# Patient Record
Sex: Male | Born: 1980
Health system: Southern US, Community
[De-identification: ages and names within clinical notes are randomized; demographics above are authoritative.]

## PROBLEM LIST (undated history)

## (undated) ENCOUNTER — Emergency Department (HOSPITAL_COMMUNITY): Discharge status: Against Medical Advice | Payer: Self-pay

## (undated) DIAGNOSIS — F32A Depression, unspecified: Secondary | ICD-10-CM

## (undated) DIAGNOSIS — F329 Major depressive disorder, single episode, unspecified: Secondary | ICD-10-CM

## (undated) HISTORY — DX: Major depressive disorder, single episode, unspecified: F32.9

## (undated) HISTORY — DX: Depression, unspecified: F32.A

---

## 1999-08-19 ENCOUNTER — Encounter: Payer: Self-pay | Admitting: Emergency Medicine

## 1999-08-19 ENCOUNTER — Emergency Department (HOSPITAL_COMMUNITY): Admission: EM | Admit: 1999-08-19 | Discharge: 1999-08-19 | Payer: Self-pay | Admitting: Emergency Medicine

## 2006-07-03 ENCOUNTER — Emergency Department (HOSPITAL_COMMUNITY): Admission: EM | Admit: 2006-07-03 | Discharge: 2006-07-03 | Payer: Self-pay | Admitting: Emergency Medicine

## 2006-07-04 ENCOUNTER — Emergency Department (HOSPITAL_COMMUNITY): Admission: EM | Admit: 2006-07-04 | Discharge: 2006-07-04 | Payer: Self-pay | Admitting: Emergency Medicine

## 2008-01-07 ENCOUNTER — Emergency Department (HOSPITAL_COMMUNITY): Admission: EM | Admit: 2008-01-07 | Discharge: 2008-01-07 | Payer: Self-pay | Admitting: Emergency Medicine

## 2011-08-27 ENCOUNTER — Emergency Department (HOSPITAL_COMMUNITY)
Admission: EM | Admit: 2011-08-27 | Discharge: 2011-08-27 | Disposition: A | Discharge status: Home / Self Care | Payer: Self-pay | Attending: Emergency Medicine | Admitting: Emergency Medicine

## 2011-08-27 DIAGNOSIS — L03211 Cellulitis of face: Secondary | ICD-10-CM | POA: Insufficient documentation

## 2011-08-27 DIAGNOSIS — L0201 Cutaneous abscess of face: Secondary | ICD-10-CM | POA: Insufficient documentation

## 2018-01-26 ENCOUNTER — Ambulatory Visit: Payer: Self-pay | Admitting: Family Medicine

## 2018-01-28 ENCOUNTER — Encounter: Payer: Self-pay | Admitting: Family Medicine

## 2018-01-28 ENCOUNTER — Ambulatory Visit (INDEPENDENT_AMBULATORY_CARE_PROVIDER_SITE_OTHER): Payer: 59 | Admitting: Family Medicine

## 2018-01-28 VITALS — BP 128/80 | HR 55 | Ht 71.25 in | Wt 203.5 lb

## 2018-01-28 DIAGNOSIS — Z0000 Encounter for general adult medical examination without abnormal findings: Secondary | ICD-10-CM

## 2018-01-28 LAB — CBC
HCT: 45.1 % (ref 39.0–52.0)
Hemoglobin: 15.4 g/dL (ref 13.0–17.0)
MCHC: 34.1 g/dL (ref 30.0–36.0)
MCV: 93.2 fl (ref 78.0–100.0)
Platelets: 268 10*3/uL (ref 150.0–400.0)
RBC: 4.83 Mil/uL (ref 4.22–5.81)
RDW: 12.7 % (ref 11.5–15.5)
WBC: 3.9 10*3/uL — ABNORMAL LOW (ref 4.0–10.5)

## 2018-01-28 LAB — URINALYSIS, ROUTINE W REFLEX MICROSCOPIC
Bilirubin Urine: NEGATIVE
Hgb urine dipstick: NEGATIVE
Ketones, ur: NEGATIVE
Leukocytes, UA: NEGATIVE
Nitrite: NEGATIVE
RBC / HPF: NONE SEEN (ref 0–?)
Specific Gravity, Urine: 1.02 (ref 1.000–1.030)
Total Protein, Urine: NEGATIVE
Urine Glucose: NEGATIVE
Urobilinogen, UA: 0.2 (ref 0.0–1.0)
pH: 6.5 (ref 5.0–8.0)

## 2018-01-28 LAB — LIPID PANEL
Cholesterol: 183 mg/dL (ref 0–200)
HDL: 39.7 mg/dL (ref >39.00)
LDL Cholesterol: 121 mg/dL — ABNORMAL HIGH (ref 0–99)
NonHDL: 142.99
Total CHOL/HDL Ratio: 5
Triglycerides: 108 mg/dL (ref 0.0–149.0)
VLDL: 21.6 mg/dL (ref 0.0–40.0)

## 2018-01-28 LAB — TSH: TSH: 1.17 u[IU]/mL (ref 0.35–4.50)

## 2018-01-28 NOTE — Progress Notes (Signed)
Subjective:  Patient ID: Alvin Smith, male    DOB: 09-10-1981  Age: 37 y.o. MRN: [id number]  CC: Establish Care   HPI Alvin Smith presents for exam.  As far as he knows he enjoys good health.  He is married and has 3 children.  38 year old daughter and 376 and 38-year-old sons.  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  He still manages to find time to work out an hour each day.  He rarely drinks alcohol and does not smoke or use illicit drugs.  His father died at age 38 of an MI.  His dad had smoked since he was 37 years old.  His mother is healthy at age 363.  History Alvin Smith has no past medical history on file.   He has no past surgical history on file.   His family history is not on file.He reports that he has never smoked. He has never used smokeless tobacco. His alcohol and drug histories are not on file.  No outpatient medications prior to visit.   No facility-administered medications prior to visit.     ROS Review of Systems  Constitutional: Negative.   HENT: Negative.   Eyes: Negative.   Respiratory: Negative.   Cardiovascular: Negative.   Gastrointestinal: Negative.   Endocrine: Negative.   Genitourinary: Negative.   Musculoskeletal: Negative.   Skin: Negative for wound.  Allergic/Immunologic: Negative for immunocompromised state.  Neurological: Negative for weakness and headaches.  Hematological: Does not bruise/bleed easily.  Psychiatric/Behavioral: Negative.     Objective:  BP 128/80 (BP Location: Left Arm, Patient Position: Sitting, Cuff Size: Normal)   Pulse (!) 55   Ht 5' 11.25" (1.81 m)   Wt 203 lb 8 oz (92.3 kg)   SpO2 98%   BMI 28.18 kg/m   Physical Exam  Constitutional: He is oriented to person, place, and time. He appears well-developed and well-nourished. No distress.  HENT:  Head: Normocephalic and atraumatic.  Right Ear: External ear normal.  Left Ear: External ear normal.    Mouth/Throat: Oropharynx is clear and moist. No oropharyngeal exudate.  Eyes: Pupils are equal, round, and reactive to light. Conjunctivae are normal. Right eye exhibits no discharge. Left eye exhibits no discharge. No scleral icterus.  Neck: Neck supple. No JVD present. No tracheal deviation present. No thyromegaly present.  Cardiovascular: Normal rate, regular rhythm and normal heart sounds.  Pulmonary/Chest: Effort normal and breath sounds normal. No stridor.  Abdominal: Soft. Bowel sounds are normal. He exhibits no distension. There is no hepatosplenomegaly, splenomegaly or hepatomegaly. There is no tenderness. No hernia. Hernia confirmed negative in the ventral area, confirmed negative in the right inguinal area and confirmed negative in the left inguinal area.  Genitourinary: Testes normal. Right testis shows no mass and no swelling. Right testis is descended. Left testis shows no mass, no swelling and no tenderness. Left testis is descended. Circumcised. No hypospadias, penile erythema or penile tenderness. No discharge found.  Lymphadenopathy:    He has no cervical adenopathy.       Right: No inguinal adenopathy present.       Left: No inguinal adenopathy present.  Neurological: He is alert and oriented to person, place, and time.  Skin: Skin is warm and dry. He is not diaphoretic.  Psychiatric: He has a normal mood and affect. His behavior is normal.      Assessment & Plan:   Alvin Smith was seen today  for establish care.  Diagnoses and all orders for this visit:  Healthcare maintenance -     CBC -     Comprehensive metabolic panel -     HIV antibody -     Lipid panel -     Urinalysis, Routine w reflex microscopic -     TSH   Alvin Smith does not currently have medications on file.  No orders of the defined types were placed in this encounter.  Continue healthy lifestyle and follow-up as needed.  Blood work results will recommended further follow-up.  Follow-up: Return  fu pends results of blood work.  Mliss Sax, MD

## 2018-01-28 NOTE — Patient Instructions (Signed)
Preventive Care 18-39 Years, Male Preventive care refers to lifestyle choices and visits with your health care provider that can promote health and wellness. What does preventive care include?  A yearly physical exam. This is also called an annual well check.  Dental exams once or twice a year.  Routine eye exams. Ask your health care provider how often you should have your eyes checked.  Personal lifestyle choices, including: ? Daily care of your teeth and gums. ? Regular physical activity. ? Eating a healthy diet. ? Avoiding tobacco and drug use. ? Limiting alcohol use. ? Practicing safe sex. What happens during an annual well check? The services and screenings done by your health care provider during your annual well check will depend on your age, overall health, lifestyle risk factors, and family history of disease. Counseling Your health care provider may ask you questions about your:  Alcohol use.  Tobacco use.  Drug use.  Emotional well-being.  Home and relationship well-being.  Sexual activity.  Eating habits.  Work and work Statistician.  Screening You may have the following tests or measurements:  Height, weight, and BMI.  Blood pressure.  Lipid and cholesterol levels. These may be checked every 5 years starting at age 80.  Diabetes screening. This is done by checking your blood sugar (glucose) after you have not eaten for a while (fasting).  Skin check.  Hepatitis C blood test.  Hepatitis B blood test.  Sexually transmitted disease (STD) testing.  Discuss your test results, treatment options, and if necessary, the need for more tests with your health care provider. Vaccines Your health care provider may recommend certain vaccines, such as:  Influenza vaccine. This is recommended every year.  Tetanus, diphtheria, and acellular pertussis (Tdap, Td) vaccine. You may need a Td booster every 10 years.  Varicella vaccine. You may need this if you  have not been vaccinated.  HPV vaccine. If you are 23 or younger, you may need three doses over 6 months.  Measles, mumps, and rubella (MMR) vaccine. You may need at least one dose of MMR.You may also need a second dose.  Pneumococcal 13-valent conjugate (PCV13) vaccine. You may need this if you have certain conditions and have not been vaccinated.  Pneumococcal polysaccharide (PPSV23) vaccine. You may need one or two doses if you smoke cigarettes or if you have certain conditions.  Meningococcal vaccine. One dose is recommended if you are age 59-21 years and a first-year college student living in a residence hall, or if you have one of several medical conditions. You may also need additional booster doses.  Hepatitis A vaccine. You may need this if you have certain conditions or if you travel or work in places where you may be exposed to hepatitis A.  Hepatitis B vaccine. You may need this if you have certain conditions or if you travel or work in places where you may be exposed to hepatitis B.  Haemophilus influenzae type b (Hib) vaccine. You may need this if you have certain risk factors.  Talk to your health care provider about which screenings and vaccines you need and how often you need them. This information is not intended to replace advice given to you by your health care provider. Make sure you discuss any questions you have with your health care provider. Document Released: 12/23/2001 Document Revised: 07/16/2016 Document Reviewed: 08/28/2015 Elsevier Interactive Patient Education  2018 [place] Maintenance, Male A healthy lifestyle and preventive care is important for your health  and wellness. Ask your health care provider about what schedule of regular examinations is right for you. What should I know about weight and diet? Eat a Healthy Diet  Eat plenty of vegetables, fruits, whole grains, low-fat dairy products, and lean protein.  Do not eat a lot of foods  high in solid fats, added sugars, or salt.  Maintain a Healthy Weight Regular exercise can help you achieve or maintain a healthy weight. You should:  Do at least 150 minutes of exercise each week. The exercise should increase your heart rate and make you sweat (moderate-intensity exercise).  Do strength-training exercises at least twice a week.  Watch Your Levels of Cholesterol and Blood Lipids  Have your blood tested for lipids and cholesterol every 5 years starting at 38 years of age. If you are at high risk for heart disease, you should start having your blood tested when you are 38 years old. You may need to have your cholesterol levels checked more often if: ? Your lipid or cholesterol levels are high. ? You are older than 38 years of age. ? You are at high risk for heart disease.  What should I know about cancer screening? Many types of cancers can be detected early and may often be prevented. Lung Cancer  You should be screened every year for lung cancer if: ? You are a current smoker who has smoked for at least 30 years. ? You are a former smoker who has quit within the past 15 years.  Talk to your health care provider about your screening options, when you should start screening, and how often you should be screened.  Colorectal Cancer  Routine colorectal cancer screening usually begins at 38 years of age and should be repeated every 5-10 years until you are 38 years old. You may need to be screened more often if early forms of precancerous polyps or small growths are found. Your health care provider may recommend screening at an earlier age if you have risk factors for colon cancer.  Your health care provider may recommend using home test kits to check for hidden blood in the stool.  A small camera at the end of a tube can be used to examine your colon (sigmoidoscopy or colonoscopy). This checks for the earliest forms of colorectal cancer.  Prostate and Testicular  Cancer  Depending on your age and overall health, your health care provider may do certain tests to screen for prostate and testicular cancer.  Talk to your health care provider about any symptoms or concerns you have about testicular or prostate cancer.  Skin Cancer  Check your skin from head to toe regularly.  Tell your health care provider about any new moles or changes in moles, especially if: ? There is a change in a mole's size, shape, or color. ? You have a mole that is larger than a pencil eraser.  Always use sunscreen. Apply sunscreen liberally and repeat throughout the day.  Protect yourself by wearing long sleeves, pants, a wide-brimmed hat, and sunglasses when outside.  What should I know about heart disease, diabetes, and high blood pressure?  If you are 70-73 years of age, have your blood pressure checked every 3-5 years. If you are 94 years of age or older, have your blood pressure checked every year. You should have your blood pressure measured twice-once when you are at a hospital or clinic, and once when you are not at a hospital or clinic. Record the average of the  two measurements. To check your blood pressure when you are not at a hospital or clinic, you can use: ? An automated blood pressure machine at a pharmacy. ? A home blood pressure monitor.  Talk to your health care provider about your target blood pressure.  If you are between 40-63 years old, ask your health care provider if you should take aspirin to prevent heart disease.  Have regular diabetes screenings by checking your fasting blood sugar level. ? If you are at a normal weight and have a low risk for diabetes, have this test once every three years after the age of 28. ? If you are overweight and have a high risk for diabetes, consider being tested at a younger age or more often.  A one-time screening for abdominal aortic aneurysm (AAA) by ultrasound is recommended for men aged 26-75 years who are  current or former smokers. What should I know about preventing infection? Hepatitis B If you have a higher risk for hepatitis B, you should be screened for this virus. Talk with your health care provider to find out if you are at risk for hepatitis B infection. Hepatitis C Blood testing is recommended for:  Everyone born from [birth year] through 1966.  Anyone with known risk factors for hepatitis C.  Sexually Transmitted Diseases (STDs)  You should be screened each year for STDs including gonorrhea and chlamydia if: ? You are sexually active and are younger than 38 years of age. ? You are older than 38 years of age and your health care provider tells you that you are at risk for this type of infection. ? Your sexual activity has changed since you were last screened and you are at an increased risk for chlamydia or gonorrhea. Ask your health care provider if you are at risk.  Talk with your health care provider about whether you are at high risk of being infected with HIV. Your health care provider may recommend a prescription medicine to help prevent HIV infection.  What else can I do?  Schedule regular health, dental, and eye exams.  Stay current with your vaccines (immunizations).  Do not use any tobacco products, such as cigarettes, chewing tobacco, and e-cigarettes. If you need help quitting, ask your health care provider.  Limit alcohol intake to no more than 2 drinks per day. One drink equals 12 ounces of beer, 5 ounces of wine, or 1 ounces of hard liquor.  Do not use street drugs.  Do not share needles.  Ask your health care provider for help if you need support or information about quitting drugs.  Tell your health care provider if you often feel depressed.  Tell your health care provider if you have ever been abused or do not feel safe at home. This information is not intended to replace advice given to you by your health care provider. Make sure you discuss any questions  you have with your health care provider. Document Released: 04/24/2008 Document Revised: 06/25/2016 Document Reviewed: 07/31/2015 Elsevier Interactive Patient Education  [employer].

## 2018-01-29 LAB — COMPREHENSIVE METABOLIC PANEL
AG Ratio: 1.7 (calc) (ref 1.0–2.5)
ALT: 27 U/L (ref 9–46)
AST: 28 U/L (ref 10–40)
Albumin: 4.5 g/dL (ref 3.6–5.1)
Alkaline phosphatase (APISO): 65 U/L (ref 40–115)
BUN: 23 mg/dL (ref 7–25)
CO2: 28 mmol/L (ref 20–32)
Calcium: 9.6 mg/dL (ref 8.6–10.3)
Chloride: 106 mmol/L (ref 98–110)
Creat: 1.18 mg/dL (ref 0.60–1.35)
Globulin: 2.7 g/dL (calc) (ref 1.9–3.7)
Glucose, Bld: 90 mg/dL (ref 65–99)
Potassium: 4.3 mmol/L (ref 3.5–5.3)
Sodium: 138 mmol/L (ref 135–146)
Total Bilirubin: 0.5 mg/dL (ref 0.2–1.2)
Total Protein: 7.2 g/dL (ref 6.1–8.1)

## 2018-01-29 LAB — HIV ANTIBODY (ROUTINE TESTING W REFLEX): HIV 1&2 Ab, 4th Generation: NONREACTIVE

## 2018-07-29 ENCOUNTER — Encounter: Payer: Self-pay | Admitting: Family Medicine

## 2018-07-29 ENCOUNTER — Ambulatory Visit (INDEPENDENT_AMBULATORY_CARE_PROVIDER_SITE_OTHER): Payer: 59 | Admitting: Family Medicine

## 2018-07-29 ENCOUNTER — Ambulatory Visit (INDEPENDENT_AMBULATORY_CARE_PROVIDER_SITE_OTHER): Payer: 59

## 2018-07-29 VITALS — BP 128/78 | HR 65 | Ht 71.25 in | Wt 193.0 lb

## 2018-07-29 DIAGNOSIS — M25561 Pain in right knee: Secondary | ICD-10-CM

## 2018-07-29 DIAGNOSIS — S83242A Other tear of medial meniscus, current injury, left knee, initial encounter: Secondary | ICD-10-CM | POA: Insufficient documentation

## 2018-07-29 DIAGNOSIS — M170 Bilateral primary osteoarthritis of knee: Secondary | ICD-10-CM | POA: Diagnosis not present

## 2018-07-29 MED ORDER — MELOXICAM 15 MG PO TABS: 15.0000 mg | ORAL_TABLET | Freq: Every day | ORAL | 0 refills | Status: DC

## 2018-07-29 NOTE — Progress Notes (Signed)
Subjective:  Patient ID: Alvin Smith, male    DOB: 1981-08-31  Age: 37 y.o. MRN: [id number]  CC: Knee Pain   HPI Alvin Smith presents for patient has been experiencing pain with both of his knees over the last 2 to 3 months.  Currently he is unable to walk any distance without pain.  The left knee is bothering him more so than the right.  No specific injury in his past.  The left knee swells and feels unstable.  There is been no locking or giving way.  He has been a runner up until he started experiencing this pain.  He can think of no sports injury in his distant past.  No outpatient medications prior to visit.   No facility-administered medications prior to visit.     ROS Review of Systems  Constitutional: Negative.   Respiratory: Negative.   Cardiovascular: Negative.   Gastrointestinal: Negative.   Musculoskeletal: Positive for arthralgias, gait problem and joint swelling.  Hematological: Does not bruise/bleed easily.  Psychiatric/Behavioral: Negative.     Objective:  BP 128/78   Pulse 65   Ht 5' 11.25" (1.81 m)   Wt 193 lb (87.5 kg)   SpO2 98%   BMI 26.73 kg/m   BP Readings from Last 3 Encounters:  07/29/18 128/78  01/28/18 128/80    Wt Readings from Last 3 Encounters:  07/29/18 193 lb (87.5 kg)  01/28/18 203 lb 8 oz (92.3 kg)    Physical Exam  Constitutional: He is oriented to person, place, and time. He appears well-developed and well-nourished. No distress.  Pulmonary/Chest: Effort normal.  Musculoskeletal:       Right knee: He exhibits normal range of motion, no swelling, no effusion and no deformity. No tenderness found. No medial joint line and no lateral joint line tenderness noted.       Left knee: He exhibits decreased range of motion, swelling and effusion (small). Tenderness found. Medial joint line tenderness noted.       Legs: Neurological: He is alert and oriented to person, place, and time.  Skin: He is not diaphoretic.    Lab Results    Component Value Date   WBC 3.9 (L) 01/28/2018   HGB 15.4 01/28/2018   HCT 45.1 01/28/2018   PLT 268.0 01/28/2018   GLUCOSE 90 01/28/2018   CHOL 183 01/28/2018   TRIG 108.0 01/28/2018   HDL 39.70 01/28/2018   LDLCALC 121 (H) 01/28/2018   ALT 27 01/28/2018   AST 28 01/28/2018   NA 138 01/28/2018   K 4.3 01/28/2018   CL 106 01/28/2018   CREATININE 1.18 01/28/2018   BUN 23 01/28/2018   CO2 28 01/28/2018   TSH 1.17 01/28/2018    No results found.  Assessment & Plan:   Alvin JohnBrian was seen today for knee pain.  Diagnoses and all orders for this visit:  Tear of medial meniscus of left knee, current, unspecified tear type, initial encounter -     DG Knee Complete 4 Views Left; Future -     MR Knee Left  Wo Contrast; Future -     meloxicam (MOBIC) 15 MG tablet; Take 1 tablet (15 mg total) by mouth daily. With food for 14 days and then as needed. -     DG Knee Complete 4 Views Left  Acute pain of right knee -     DG Knee Complete 4 Views Right; Future -     meloxicam (MOBIC) 15 MG tablet; Take 1  tablet (15 mg total) by mouth daily. With food for 14 days and then as needed. -     DG Knee Complete 4 Views Right   I am having Alvin Smith start on meloxicam.  Meds ordered this encounter  Medications  . meloxicam (MOBIC) 15 MG tablet    Sig: Take 1 tablet (15 mg total) by mouth daily. With food for 14 days and then as needed.    Dispense:  30 tablet    Refill:  0     Follow-up: Return in about 1 month (around 08/28/2018).  Mliss Sax, MD

## 2018-07-29 NOTE — Patient Instructions (Signed)

## 2018-08-07 ENCOUNTER — Other Ambulatory Visit: Payer: 59

## 2018-08-07 ENCOUNTER — Ambulatory Visit
Admission: RE | Admit: 2018-08-07 | Discharge: 2018-08-07 | Disposition: A | Discharge status: Home / Self Care | Payer: 59 | Source: Ambulatory Visit | Attending: Family Medicine | Admitting: Family Medicine

## 2018-08-07 DIAGNOSIS — S83242A Other tear of medial meniscus, current injury, left knee, initial encounter: Secondary | ICD-10-CM

## 2018-08-07 DIAGNOSIS — M23322 Other meniscus derangements, posterior horn of medial meniscus, left knee: Secondary | ICD-10-CM | POA: Diagnosis not present

## 2018-08-11 ENCOUNTER — Other Ambulatory Visit: Payer: Self-pay

## 2018-08-11 DIAGNOSIS — S83242A Other tear of medial meniscus, current injury, left knee, initial encounter: Secondary | ICD-10-CM

## 2018-08-17 ENCOUNTER — Ambulatory Visit (INDEPENDENT_AMBULATORY_CARE_PROVIDER_SITE_OTHER): Payer: 59 | Admitting: Orthopaedic Surgery

## 2018-08-17 ENCOUNTER — Encounter (INDEPENDENT_AMBULATORY_CARE_PROVIDER_SITE_OTHER): Payer: Self-pay | Admitting: Orthopaedic Surgery

## 2018-08-17 VITALS — BP 123/71 | HR 53 | Ht 71.0 in | Wt 193.0 lb

## 2018-08-17 DIAGNOSIS — M23204 Derangement of unspecified medial meniscus due to old tear or injury, left knee: Secondary | ICD-10-CM | POA: Diagnosis not present

## 2018-08-17 DIAGNOSIS — M1712 Unilateral primary osteoarthritis, left knee: Secondary | ICD-10-CM

## 2018-08-17 MED ORDER — BUPIVACAINE HCL 0.5 % IJ SOLN
2.0000 mL | INTRAMUSCULAR | Status: AC | PRN
  Administered 2018-08-17: 2 mL via INTRA_ARTICULAR

## 2018-08-17 MED ORDER — METHYLPREDNISOLONE ACETATE 40 MG/ML IJ SUSP
80.0000 mg | INTRAMUSCULAR | Status: AC | PRN
  Administered 2018-08-17: 80 mg

## 2018-08-17 MED ORDER — LIDOCAINE HCL 1 % IJ SOLN
2.0000 mL | INTRAMUSCULAR | Status: AC | PRN
  Administered 2018-08-17: 2 mL

## 2018-08-17 NOTE — Progress Notes (Signed)
Office Visit Note   Patient: Alvin Smith           Date of Birth: [birth year]           MRN: [id number] Visit Date: 08/17/2018              Requested by: Mliss Sax, MD [address] [place], [place] [address] PCP: Mliss Sax, MD   Assessment & Plan: Visit Diagnoses:  1. Degenerative tear of medial meniscus of left knee   2. Unilateral primary osteoarthritis, left knee     Plan:  #1: Corticosteroid injection to the left knee was accomplished atraumatically. #2: Limit his exercises to nonweightbearing or limited weightbearing activities.  Thus no running. #3: If doing well in the next 4 weeks he may then increase his activity but understand that he should do low impact exercises. #4: We had a long discussion of limiting his activities such as running.  He will be able to work out in the gym but again limiting impact.  He can do short arc quads etc. comfortably as he desires.  Icing to the knee as indicated.  Follow-Up Instructions: Return in about 4 weeks (around 09/14/2018).   Face-to-face time spent with patient was greater than 30 minutes.  Greater than 50% of the time was spent in counseling and coordination of care.   Orders:  Orders Placed This Encounter  Procedures  . Large Joint Inj: L knee   No orders of the defined types were placed in this encounter.     Procedures: Large Joint Inj: L knee on 08/17/2018 12:05 PM Indications: pain and diagnostic evaluation Details: 25 G 1.5 in needle, anteromedial approach  Arthrogram: No  Medications: 2 mL bupivacaine 0.5 %; 2 mL lidocaine 1 %; 80 mg methylPREDNISolone acetate 40 MG/ML Procedure, treatment alternatives, risks and benefits explained, specific risks discussed. Consent was given by the patient. Patient was prepped and draped in the usual sterile fashion.       Clinical Data: No additional findings.   Subjective: Chief Complaint  Patient presents with  . Left Knee - Pain   . New Patient (Initial Visit)    L KNEE PAIN 2 MO THINKS MAY HAVE INJURED IT AT WORK, NO INJECTIONS OR PRIOR SURGERY. SEEN PCP HAD X RAY AND MRI AND WAS REFERRED HERE    HPI  Alvin Smith is a very pleasant 37 year old African-American male who presents today with left knee pain over the past 2 to 3months.  He was seen by his medical doctor who on the July 29, 2018 had an evaluation.  He denied any specific injury or trauma to this area in the past that he realizes.  Does like to run but because of his pain discomfort he is not able to do this.  He started having this pain discomfort seen by his medical doctor Dr. Doreene Burke and an MRI scan was ordered.  This revealed a small tear of the free edge of the posterior horn of the medial meniscus.  It also showed high-grade partial-thickness cartilage loss of the medial femoral condyle and medial tibial plateau with mild subchondral reactivity marrow edema at the periphery of the medial femoral-tibial compartment.  He was then referred to [place] for evaluation.   Review of Systems  Constitutional: Negative for fatigue and fever.  HENT: Negative for ear pain.   Eyes: Negative for pain.  Respiratory: Negative for cough and shortness of breath.   Cardiovascular: Negative for leg swelling.  Gastrointestinal: Negative  for constipation and diarrhea.  Genitourinary: Negative for difficulty urinating.  Musculoskeletal: Negative for back pain and neck pain.  Skin: Negative for rash.  Allergic/Immunologic: Negative for food allergies.  Neurological: Positive for weakness. Negative for numbness.  Hematological: Does not bruise/bleed easily.  Psychiatric/Behavioral: Negative for sleep disturbance.     Objective: Vital Signs: BP 123/71 (BP Location: Left Arm, Patient Position: Sitting, Cuff Size: Normal)   Pulse (!) 53   Ht 5\' 11"  (1.803 m)   Wt 193 lb (87.5 kg)   BMI 26.92 kg/m   Physical Exam  Constitutional: He is oriented to person, place, and time. He  appears well-developed and well-nourished.  HENT:  Mouth/Throat: Oropharynx is clear and moist.  Eyes: Pupils are equal, round, and reactive to light. EOM are normal.  Pulmonary/Chest: Effort normal.  Neurological: He is alert and oriented to person, place, and time.  Skin: Skin is warm and dry.  Psychiatric: He has a normal mood and affect. His behavior is normal.    Ortho Exam  His exam today reveals a 1+ effusion without warmth or erythema.  Good patellofemoral motion.  Ranging from near full extension to 105degrees of flexion but this is limiting him because of the effusion.  He is ligamentously stable.  Does have some tenderness to palpation over the medial joint line but not profuse.  Neurovascular intact distally.  Specialty Comments:  No specialty comments available.  Imaging: Mr Knee Left  Wo Contrast  Result Date: 08/09/2018 CLINICAL DATA:  Left medial knee pain with swelling since July 2019. EXAM: MRI OF THE LEFT KNEE WITHOUT CONTRAST TECHNIQUE: Multiplanar, multisequence MR imaging of the knee was performed. No intravenous contrast was administered. COMPARISON:  None. FINDINGS: MENISCI Medial meniscus: Small tear of the free edge of the posterior horn of medial meniscus. Focal increased signal in the posterior horn of the medial meniscus consistent with degeneration. Lateral meniscus:  Intact. LIGAMENTS Cruciates:  Intact ACL and PCL. Collaterals: Small amount of fluid superficial to the MCL with the MCL intact which may reflect mild grade 1 MCL strain. Lateral collateral ligament complex is intact. CARTILAGE Patellofemoral:  No chondral defect. Medial: High-grade partial-thickness cartilage loss of the medial femoral condyle and medial tibial plateau with mild subchondral reactive marrow edema at the periphery of the medial femorotibial compartment. Lateral:  No focal chondral defect. Joint: Moderate joint effusion. Minimal edema in Hoffa's fat. No plical thickening. Popliteal Fossa:   Small Baker's cyst.  Intact popliteus tendon. Extensor Mechanism: Intact quadriceps tendon. Intact patellar tendon. Intact medial patellar retinaculum. Intact lateral patellar retinaculum. Intact MPFL. Bones:  No acute osseous abnormality.  No aggressive osseous lesion. Other: No fluid collection or hematoma.  Muscles are normal. IMPRESSION: 1. Small tear of the free edge of the posterior horn of medial meniscus. Focal increased signal in the posterior horn of the medial meniscus consistent with degeneration. 2. High-grade partial-thickness cartilage loss of the medial femoral condyle and medial tibial plateau with mild subchondral reactive marrow edema at the periphery of the medial femorotibial compartment. Electronically Signed   By: Elige Ko   On: 08/09/2018 08:41     PMFS History: Current Outpatient Medications  Medication Sig Dispense Refill  . meloxicam (MOBIC) 15 MG tablet Take 1 tablet (15 mg total) by mouth daily. With food for 14 days and then as needed. (Patient not taking: Reported on 08/17/2018) 30 tablet 0   No current facility-administered medications for this visit.     Patient Active Problem List  Diagnosis Date Noted  . Tear of medial meniscus of left knee, current 07/29/2018  . Acute pain of right knee 07/29/2018  . Healthcare maintenance 01/28/2018   Past Medical History:  Diagnosis Date  . Depression     Family History  Problem Relation Age of Onset  . Heart attack Father   . Hyperlipidemia Father   . Asthma Brother   . Asthma Son     History reviewed. No pertinent surgical history. Social History   Occupational History  . Not on file  Tobacco Use  . Smoking status: Former Smoker    Last attempt to quit: 2004    Years since quitting: 15.7  . Smokeless tobacco: Never Used  Substance and Sexual Activity  . Alcohol use: Not Currently  . Drug use: Not Currently  . Sexual activity: Not on file

## 2018-08-26 ENCOUNTER — Encounter (INDEPENDENT_AMBULATORY_CARE_PROVIDER_SITE_OTHER): Payer: Self-pay | Admitting: Orthopaedic Surgery

## 2018-08-26 ENCOUNTER — Ambulatory Visit (INDEPENDENT_AMBULATORY_CARE_PROVIDER_SITE_OTHER): Payer: 59 | Admitting: Orthopaedic Surgery

## 2018-08-26 VITALS — BP 124/70 | HR 58 | Ht 71.0 in | Wt 193.0 lb

## 2018-08-26 DIAGNOSIS — G8929 Other chronic pain: Secondary | ICD-10-CM | POA: Diagnosis not present

## 2018-08-26 DIAGNOSIS — M25561 Pain in right knee: Secondary | ICD-10-CM

## 2018-08-26 MED ORDER — METHYLPREDNISOLONE ACETATE 40 MG/ML IJ SUSP
80.0000 mg | INTRAMUSCULAR | Status: AC | PRN
  Administered 2018-08-26: 80 mg

## 2018-08-26 MED ORDER — LIDOCAINE HCL 1 % IJ SOLN
2.0000 mL | INTRAMUSCULAR | Status: AC | PRN
  Administered 2018-08-26: 2 mL

## 2018-08-26 MED ORDER — BUPIVACAINE HCL 0.5 % IJ SOLN
2.0000 mL | INTRAMUSCULAR | Status: AC | PRN
  Administered 2018-08-26: 2 mL via INTRA_ARTICULAR

## 2018-08-26 NOTE — Progress Notes (Signed)
Office Visit Note   Patient: Alvin Smith           Date of Birth: 10-31-1981           MRN: [id number] Visit Date: 08/26/2018              Requested by: Mliss Sax, MD [address] [place], [place] [address] PCP: Mliss Sax, MD   Assessment & Plan: Visit Diagnoses:  1. Chronic pain of right knee     Plan: Effusion right knee with probable mild osteoarthritis similar to the left knee.  Will aspirate inject with cortisone.  Has done very well with the injection to her left knee  Follow-Up Instructions: Return if symptoms worsen or fail to improve.   Orders:  Orders Placed This Encounter  Procedures  . Large Joint Inj: R knee   No orders of the defined types were placed in this encounter.     Procedures: Large Joint Inj: R knee on 08/26/2018 11:39 AM Indications: pain and diagnostic evaluation Details: 25 G 1.5 in needle, anteromedial approach  Arthrogram: No  Medications: 2 mL lidocaine 1 %; 2 mL bupivacaine 0.5 %; 80 mg methylPREDNISolone acetate 40 MG/ML Aspirate: 8 mL clear Procedure, treatment alternatives, risks and benefits explained, specific risks discussed. Consent was given by the patient. Immediately prior to procedure a time out was called to verify the correct patient, procedure, equipment, support staff and site/side marked as required. Patient was prepped and draped in the usual sterile fashion.       Clinical Data: No additional findings.   Subjective: Chief Complaint  Patient presents with  . Follow-up    L KNEE INJECTION HAS HELPED A LOT, NO ISSUES  Alvin Smith relates that he is done very well with cortisone injections left knee and is essentially asymptomatic.  He has developed an effusion and mild pain of his right knee without injury or trauma.  He is had a little bit of discomfort along the medial compartment.  Has not used any ambulatory aid.  No numbness or tingling.  Add films of his left knee last month  revealing mild degenerative changes in the medial and patellofemoral compartments  Bilaterally. HPI  Review of Systems  Constitutional: Negative for fatigue and fever.  HENT: Negative for ear pain.   Eyes: Negative for pain.  Respiratory: Negative for cough and shortness of breath.   Cardiovascular: Positive for leg swelling.  Gastrointestinal: Negative for constipation and diarrhea.  Genitourinary: Negative for difficulty urinating.  Musculoskeletal: Negative for back pain and neck pain.  Skin: Negative for rash.  Allergic/Immunologic: Negative for food allergies.  Neurological: Positive for weakness. Negative for numbness.  Hematological: Does not bruise/bleed easily.  Psychiatric/Behavioral: Negative for sleep disturbance.     Objective: Vital Signs: BP 124/70 (BP Location: Left Arm, Patient Position: Sitting, Cuff Size: Normal)   Pulse (!) 58   Ht 5\' 11"  (1.803 m)   Wt 193 lb (87.5 kg)   BMI 26.92 kg/m   Physical Exam  Constitutional: He is oriented to person, place, and time. He appears well-developed and well-nourished.  HENT:  Mouth/Throat: Oropharynx is clear and moist.  Eyes: Pupils are equal, round, and reactive to light. EOM are normal.  Pulmonary/Chest: Effort normal.  Neurological: He is alert and oriented to person, place, and time.  Skin: Skin is warm and dry.  Psychiatric: He has a normal mood and affect. His behavior is normal.    Ortho Exam awake alert and  oriented x3.  Comfortable sitting.  Examination left knee reveals no effusion no pain and full easy range of motion.  No skin changes.  Left knee with small effusion of predominately medial joint pain anteriorly and posteriorly.  No popping or clicking.  No loss of motion.  No instability.  Specialty Comments:  No specialty comments available.  Imaging: No results found.   PMFS History: Patient Active Problem List   Diagnosis Date Noted  . Tear of medial meniscus of left knee, current  07/29/2018  . Acute pain of right knee 07/29/2018  . Healthcare maintenance 01/28/2018   Past Medical History:  Diagnosis Date  . Depression     Family History  Problem Relation Age of Onset  . Heart attack Father   . Hyperlipidemia Father   . Asthma Brother   . Asthma Son     History reviewed. No pertinent surgical history. Social History   Occupational History  . Not on file  Tobacco Use  . Smoking status: Former Smoker    Last attempt to quit: 2004    Years since quitting: 15.8  . Smokeless tobacco: Never Used  Substance and Sexual Activity  . Alcohol use: Not Currently  . Drug use: Not Currently  . Sexual activity: Not on file

## 2018-08-30 ENCOUNTER — Encounter: Payer: Self-pay | Admitting: Family Medicine

## 2018-08-30 ENCOUNTER — Ambulatory Visit (INDEPENDENT_AMBULATORY_CARE_PROVIDER_SITE_OTHER): Payer: 59 | Admitting: Family Medicine

## 2018-08-30 VITALS — BP 100/60 | HR 64 | Ht 71.0 in

## 2018-08-30 DIAGNOSIS — Z23 Encounter for immunization: Secondary | ICD-10-CM

## 2018-08-30 DIAGNOSIS — E7800 Pure hypercholesterolemia, unspecified: Secondary | ICD-10-CM | POA: Diagnosis not present

## 2018-08-30 DIAGNOSIS — S83242D Other tear of medial meniscus, current injury, left knee, subsequent encounter: Secondary | ICD-10-CM | POA: Diagnosis not present

## 2018-08-30 NOTE — Progress Notes (Signed)
Subjective:  Patient ID: Alvin Smith, male    DOB: [birth year]  Age: 37 y.o. MRN: [id number]  CC: Follow-up   HPI Alvin Smith presents for follow-up of his left knee pain.  Cortisone injection has helped the knee.  Patient is also interested in lowering his cholesterol.  His father's death at age 34 from an MI several present on his mind.  Patient's father had smoked since he was 5 years old.  Patient does not smoke.  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  Patient has normal blood pressure.  Outpatient Medications Prior to Visit  Medication Sig Dispense Refill  . meloxicam (MOBIC) 15 MG tablet Take 1 tablet (15 mg total) by mouth daily. With food for 14 days and then as needed. (Patient not taking: Reported on 08/17/2018) 30 tablet 0   No facility-administered medications prior to visit.     ROS Review of Systems  Constitutional: Negative.   Respiratory: Negative.   Cardiovascular: Negative.   Gastrointestinal: Negative.   Hematological: Negative.   Psychiatric/Behavioral: Negative.     Objective:  BP 100/60   Pulse 64   Ht 5\' 11"  (1.803 m)   SpO2 97%   BMI 26.92 kg/m   BP Readings from Last 3 Encounters:  08/30/18 100/60  08/26/18 124/70  08/17/18 123/71    Wt Readings from Last 3 Encounters:  08/26/18 193 lb (87.5 kg)  08/17/18 193 lb (87.5 kg)  07/29/18 193 lb (87.5 kg)    Physical Exam  Constitutional: He is oriented to person, place, and time. He appears well-developed and well-nourished. No distress.  HENT:  Head: Normocephalic and atraumatic.  Right Ear: External ear normal.  Left Ear: External ear normal.  Eyes: Right eye exhibits no discharge. Left eye exhibits no discharge. No scleral icterus.  Neck: No JVD present. No tracheal deviation present.  Pulmonary/Chest: Effort normal.  Neurological: He is alert and oriented to person, place, and time.  Skin: He is not diaphoretic.  Psychiatric: He has a normal mood and  affect. His behavior is normal.    Lab Results  Component Value Date   WBC 3.9 (L) 01/28/2018   HGB 15.4 01/28/2018   HCT 45.1 01/28/2018   PLT 268.0 01/28/2018   GLUCOSE 90 01/28/2018   CHOL 183 01/28/2018   TRIG 108.0 01/28/2018   HDL 39.70 01/28/2018   LDLCALC 121 (H) 01/28/2018   ALT 27 01/28/2018   AST 28 01/28/2018   NA 138 01/28/2018   K 4.3 01/28/2018   CL 106 01/28/2018   CREATININE 1.18 01/28/2018   BUN 23 01/28/2018   CO2 28 01/28/2018   TSH 1.17 01/28/2018    Mr Knee Left  Wo Contrast  Result Date: 08/09/2018 CLINICAL DATA:  Left medial knee pain with swelling since July 2019. EXAM: MRI OF THE LEFT KNEE WITHOUT CONTRAST TECHNIQUE: Multiplanar, multisequence MR imaging of the knee was performed. No intravenous contrast was administered. COMPARISON:  None. FINDINGS: MENISCI Medial meniscus: Small tear of the free edge of the posterior horn of medial meniscus. Focal increased signal in the posterior horn of the medial meniscus consistent with degeneration. Lateral meniscus:  Intact. LIGAMENTS Cruciates:  Intact ACL and PCL. Collaterals: Small amount of fluid superficial to the MCL with the MCL intact which may reflect mild grade 1 MCL strain. Lateral collateral ligament complex is intact. CARTILAGE Patellofemoral:  No chondral defect. Medial: High-grade partial-thickness cartilage loss of the medial femoral condyle and  medial tibial plateau with mild subchondral reactive marrow edema at the periphery of the medial femorotibial compartment. Lateral:  No focal chondral defect. Joint: Moderate joint effusion. Minimal edema in Hoffa's fat. No plical thickening. Popliteal Fossa:  Small Baker's cyst.  Intact popliteus tendon. Extensor Mechanism: Intact quadriceps tendon. Intact patellar tendon. Intact medial patellar retinaculum. Intact lateral patellar retinaculum. Intact MPFL. Bones:  No acute osseous abnormality.  No aggressive osseous lesion. Other: No fluid collection or hematoma.   Muscles are normal. IMPRESSION: 1. Small tear of the free edge of the posterior horn of medial meniscus. Focal increased signal in the posterior horn of the medial meniscus consistent with degeneration. 2. High-grade partial-thickness cartilage loss of the medial femoral condyle and medial tibial plateau with mild subchondral reactive marrow edema at the periphery of the medial femorotibial compartment. Electronically Signed   By: Elige Ko   On: 08/09/2018 08:41    Assessment & Plan:   Alvin Smith was seen today for follow-up.  Diagnoses and all orders for this visit:  Tear of medial meniscus of left knee, current, unspecified tear type, subsequent encounter  Elevated LDL cholesterol level   I have discontinued Alvin Smith's meloxicam.  No orders of the defined types were placed in this encounter.  Patient will incorporate more nonweightbearing modalities to his aerobic conditioning.  Anticipatory guidance was given to him on ways to lower his LDL cholesterol including weight loss and a low-fat low-cholesterol diet.  Suggested that these things will also tend to elevate his HDL cholesterol and minimize his risk for developing heart disease.  Follow-up: Return in about 1 year (around 08/31/2019), or if symptoms worsen or fail to improve.  Mliss Sax, MD

## 2018-08-30 NOTE — Patient Instructions (Signed)
Fat and Cholesterol Restricted Diet Getting too much fat and cholesterol in your diet may cause health problems. Following this diet helps keep your fat and cholesterol at normal levels. This can keep you from getting sick. What types of fat should I choose?  Choose monosaturated and polyunsaturated fats. These are found in foods such as olive oil, canola oil, flaxseeds, walnuts, almonds, and seeds.  Eat more omega-3 fats. Good choices include salmon, mackerel, sardines, tuna, flaxseed oil, and ground flaxseeds.  Limit saturated fats. These are in animal products such as meats, butter, and cream. They can also be in plant products such as palm oil, palm kernel oil, and coconut oil.  Avoid foods with partially hydrogenated oils in them. These contain trans fats. Examples of foods that have trans fats are stick margarine, some tub margarines, cookies, crackers, and other baked goods. What general guidelines do I need to follow?  Check food labels. Look for the words "trans fat" and "saturated fat."  When preparing a meal: ? Fill half of your plate with vegetables and green salads. ? Fill one fourth of your plate with whole grains. Look for the word "whole" as the first word in the ingredient list. ? Fill one fourth of your plate with lean protein foods.  Eat more foods that have fiber, like apples, carrots, beans, peas, and barley.  Eat more home-cooked foods. Eat less at restaurants and buffets.  Limit or avoid alcohol.  Limit foods high in starch and sugar.  Limit fried foods.  Cook foods without frying them. Baking, boiling, grilling, and broiling are all great options.  Lose weight if you are overweight. Losing even a small amount of weight can help your overall health. It can also help prevent diseases such as diabetes and heart disease. What foods can I eat? Grains Whole grains, such as whole wheat or whole grain breads, crackers, cereals, and pasta. Unsweetened oatmeal,  bulgur, barley, quinoa, or brown rice. Corn or whole wheat flour tortillas. Vegetables Fresh or frozen vegetables (raw, steamed, roasted, or grilled). Green salads. Fruits All fresh, canned (in natural juice), or frozen fruits. Meat and Other Protein Products Ground beef (85% or leaner), grass-fed beef, or beef trimmed of fat. Skinless chicken or turkey. Ground chicken or turkey. Pork trimmed of fat. All fish and seafood. Eggs. Dried beans, peas, or lentils. Unsalted nuts or seeds. Unsalted canned or dry beans. Dairy Low-fat dairy products, such as skim or 1% milk, 2% or reduced-fat cheeses, low-fat ricotta or cottage cheese, or plain low-fat yogurt. Fats and Oils Tub margarines without trans fats. Light or reduced-fat mayonnaise and salad dressings. Avocado. Olive, canola, sesame, or safflower oils. Natural peanut or almond butter (choose ones without added sugar and oil). The items listed above may not be a complete list of recommended foods or beverages. Contact your dietitian for more options. What foods are not recommended? Grains White bread. White pasta. White rice. Cornbread. Bagels, pastries, and croissants. Crackers that contain trans fat. Vegetables White potatoes. Corn. Creamed or fried vegetables. Vegetables in a cheese sauce. Fruits Dried fruits. Canned fruit in light or heavy syrup. Fruit juice. Meat and Other Protein Products Fatty cuts of meat. Ribs, chicken wings, bacon, sausage, bologna, salami, chitterlings, fatback, hot dogs, bratwurst, and packaged luncheon meats. Liver and organ meats. Dairy Whole or 2% milk, cream, half-and-half, and cream cheese. Whole milk cheeses. Whole-fat or sweetened yogurt. Full-fat cheeses. Nondairy creamers and whipped toppings. Processed cheese, cheese spreads, or cheese curds. Sweets and Desserts Corn   syrup, sugars, honey, and molasses. Candy. Jam and jelly. Syrup. Sweetened cereals. Cookies, pies, cakes, donuts, muffins, and ice  cream. Fats and Oils Butter, stick margarine, lard, shortening, ghee, or bacon fat. Coconut, palm kernel, or palm oils. Beverages Alcohol. Sweetened drinks (such as sodas, lemonade, and fruit drinks or punches). The items listed above may not be a complete list of foods and beverages to avoid. Contact your dietitian for more information. This information is not intended to replace advice given to you by your health care provider. Make sure you discuss any questions you have with your health care provider. Document Released: 04/27/2012 Document Revised: 07/03/2016 Document Reviewed: 01/26/2014 [employer] Interactive Patient Education  2018 [employer]  

## 2018-09-20 ENCOUNTER — Ambulatory Visit (INDEPENDENT_AMBULATORY_CARE_PROVIDER_SITE_OTHER): Payer: 59 | Admitting: Orthopaedic Surgery

## 2018-10-19 ENCOUNTER — Ambulatory Visit (INDEPENDENT_AMBULATORY_CARE_PROVIDER_SITE_OTHER): Payer: 59 | Admitting: Orthopaedic Surgery

## 2018-10-19 ENCOUNTER — Encounter (INDEPENDENT_AMBULATORY_CARE_PROVIDER_SITE_OTHER): Payer: Self-pay | Admitting: Orthopaedic Surgery

## 2018-10-19 VITALS — BP 133/61 | HR 56 | Ht 71.0 in | Wt 193.0 lb

## 2018-10-19 DIAGNOSIS — M170 Bilateral primary osteoarthritis of knee: Secondary | ICD-10-CM | POA: Diagnosis not present

## 2018-10-19 MED ORDER — DICLOFENAC SODIUM 1 % TD GEL: 4.0000 g | Freq: Four times a day (QID) | TRANSDERMAL | 0 refills | Status: DC

## 2018-10-19 NOTE — Progress Notes (Signed)
Office Visit Note   Patient: Alvin DaneBrian A Ciaravino           Date of Birth: 04/25/1981           MRN: [id number] Visit Date: 10/19/2018              Requested by: Mliss SaxKremer, William Alfred, MD [address] [place], [place] [id number] PCP: Mliss SaxKremer, William Alfred, MD   Assessment & Plan: Visit Diagnoses:  1. Bilateral primary osteoarthritis of knee     Plan: Bilateral knee pain with evidence of osteoarthritis of the left knee by MRI scan and right knee by plain films Long discussion regarding different treatment options.  There is a small tear of the medial meniscus by MRI scan of the left knee.  I am not sure that symptomatic.  We discussed exercises appropriate medicines.  Will prescribe Voltaren gel.  Could always repeat cortisone injections or consider knee arthroscopy particularly on the left.  He would like to continue to proceed with nonoperative course at this point  Follow-Up Instructions: Return if symptoms worsen or fail to improve.   Orders:  No orders of the defined types were placed in this encounter.  Meds ordered this encounter  Medications  . diclofenac sodium (VOLTAREN) 1 % GEL    Sig: Apply 4 g topically 4 (four) times daily.    Dispense:  5 Tube    Refill:  0      Procedures: No procedures performed   Clinical Data: No additional findings.   Subjective: Chief Complaint  Patient presents with  . Right Knee - Pain    Sharpe pains all around the knee, stiffness, no new injury  . Left Knee - Pain  Mr. Alvin Smith is 37 years old accompanied by his wife and has been seen several times in the past [facility].  He has had x-rays demonstrating decrease in the medial joint space consistent with arthritis.  He also had an MRI scan of his left knee in September demonstrating a small tear of the posterior horn of the medial meniscus associated with mild to moderate arthritic changes tickly in the medial compartment.  He has had cortisone  injections with good relief.  He returns for further evaluation and treatment options.  He is frustrated because he is young.  He does not smoke.  Not diabetic.  No prior injury or trauma.  He is stiff and sore in the morning and feels like his symptoms are more consistent with the arthritis  HPI  Review of Systems   Objective: Vital Signs: BP 133/61 (BP Location: Left Arm, Patient Position: Sitting)   Pulse (!) 56   Ht 5\' 11"  (1.803 m)   Wt 193 lb (87.5 kg)   BMI 26.92 kg/m   Physical Exam  Constitutional: He is oriented to person, place, and time. He appears well-developed and well-nourished.  HENT:  Mouth/Throat: Oropharynx is clear and moist.  Eyes: Pupils are equal, round, and reactive to light. EOM are normal.  Pulmonary/Chest: Effort normal.  Neurological: He is alert and oriented to person, place, and time.  Skin: Skin is warm and dry.  Psychiatric: He has a normal mood and affect. His behavior is normal.    Ortho Exam awake alert and oriented x3.  Comfortable sitting.  Has bilateral knee pain along the medial compartments without popping or clicking.  No patella pain or crepitation.  No effusion.  Full extension of flexion over 105 degrees without instability  bilaterally.  No distal edema.  Neurologically intact.  Left knee does demonstrate some pain along the posterior medial joint line in the area of the meniscal tear  Specialty Comments:  No specialty comments available.  Imaging: No results found.   PMFS History: Patient Active Problem List   Diagnosis Date Noted  . Bilateral primary osteoarthritis of knee 10/19/2018  . Elevated LDL cholesterol level 08/30/2018  . Tear of medial meniscus of left knee, current 07/29/2018  . Acute pain of right knee 07/29/2018  . Healthcare maintenance 01/28/2018   Past Medical History:  Diagnosis Date  . Depression     Family History  Problem Relation Age of Onset  . Heart attack Father   . Hyperlipidemia Father   .  Asthma Brother   . Asthma Son     History reviewed. No pertinent surgical history. Social History   Occupational History  . Not on file  Tobacco Use  . Smoking status: Former Smoker    Last attempt to quit: 2004    Years since quitting: 15.9  . Smokeless tobacco: Never Used  Substance and Sexual Activity  . Alcohol use: Not Currently  . Drug use: Not Currently  . Sexual activity: Not on file     Valeria Batman, MD   Note - This record has been created using [employer].  Chart creation errors have been sought, but may not always  have been located. Such creation errors do not reflect on  the standard of medical care.

## 2019-01-09 ENCOUNTER — Other Ambulatory Visit (INDEPENDENT_AMBULATORY_CARE_PROVIDER_SITE_OTHER): Payer: Self-pay | Admitting: Orthopaedic Surgery

## 2019-01-10 NOTE — Telephone Encounter (Signed)
Please send. Thank you

## 2019-01-10 NOTE — Telephone Encounter (Signed)
Ok to renew?  

## 2019-06-30 IMAGING — MR MR KNEE*L* W/O CM
4 of 6 series · 22 of 40 positions shown · non-contrast
Comparison: None.

CLINICAL DATA: Left medial knee pain with swelling since May 2018.

EXAM:
MRI OF THE LEFT KNEE WITHOUT CONTRAST
TECHNIQUE: Multiplanar, multisequence MR imaging of the knee was performed. No
intravenous contrast was administered.

[Series 6: T2 fat-sat · coronal · 4.0mm · 0.59mm/px · 5 of 21 slices shown (1 of 2)]
[im 1/21]
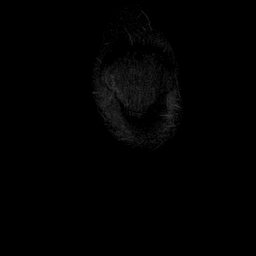
[im 6/21]
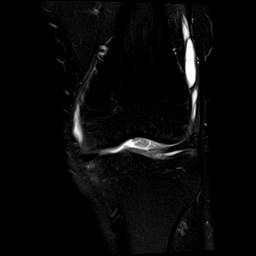
[im 11/21]
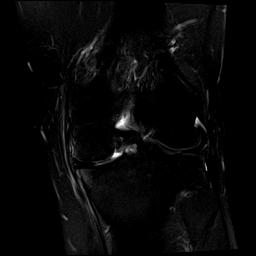
[im 16/21]
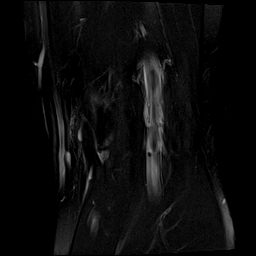
[im 21/21]
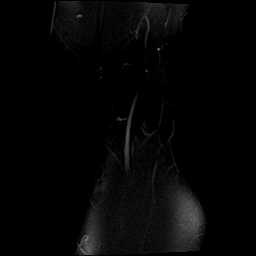

[Series 7: T1 · coronal · 4.0mm · 0.29mm/px · 3 of 23 slices shown]
[im 5/23]
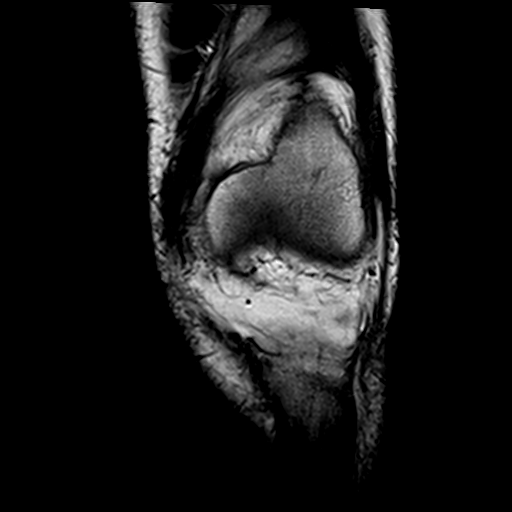
[im 14/23]
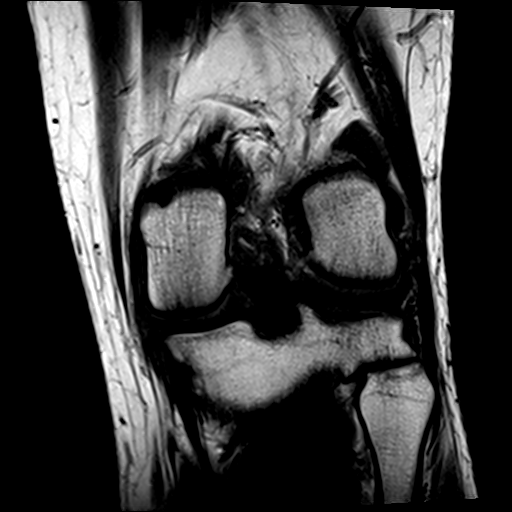
[im 23/23]
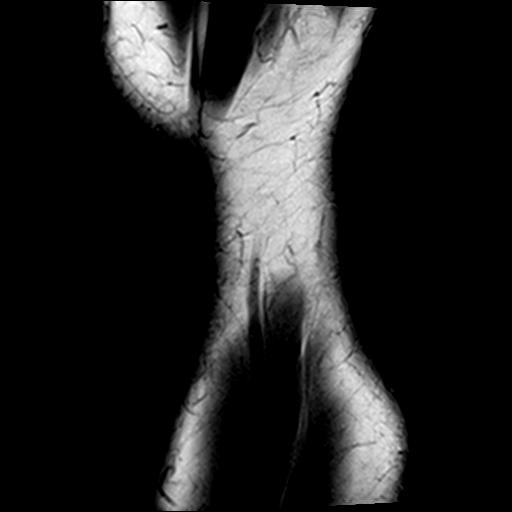

[Series 9: PD fat-sat · sagittal · 3.0mm · 0.29mm/px · 7 of 27 slices shown]
[im 1/27]
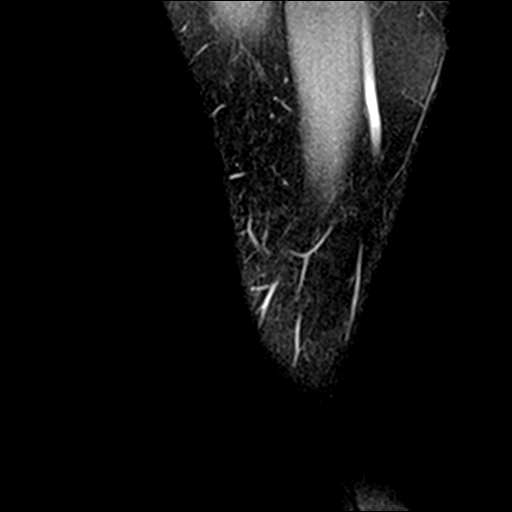
[im 5/27]
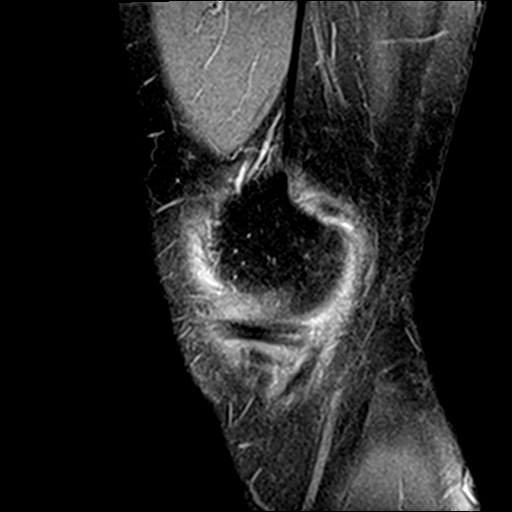
[im 9/27]
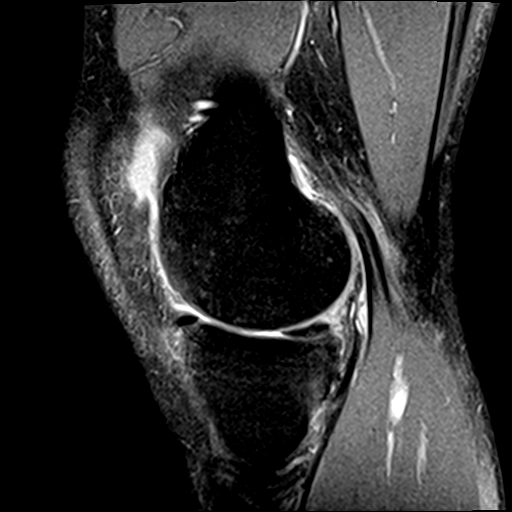
[im 14/27]
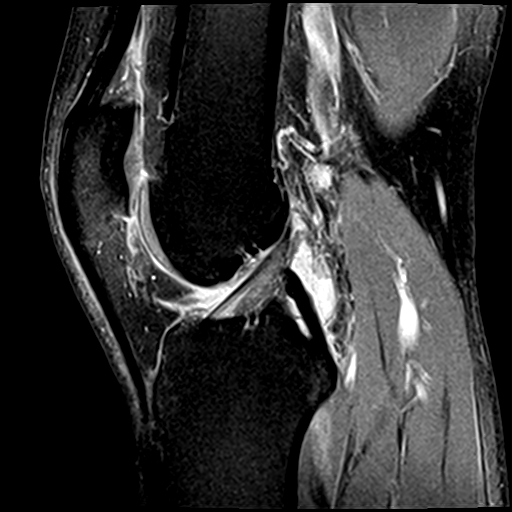
[im 18/27]
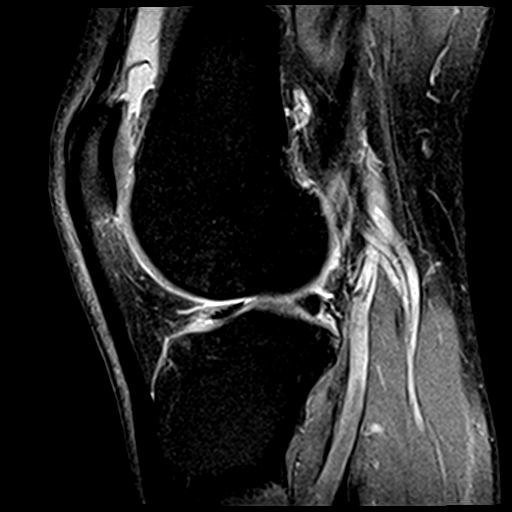
[im 22/27]
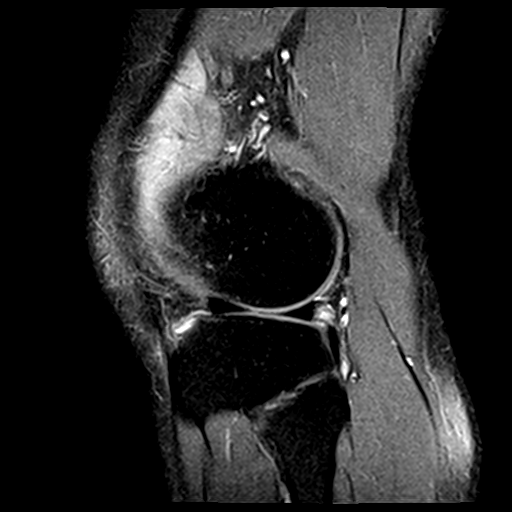
[im 27/27]
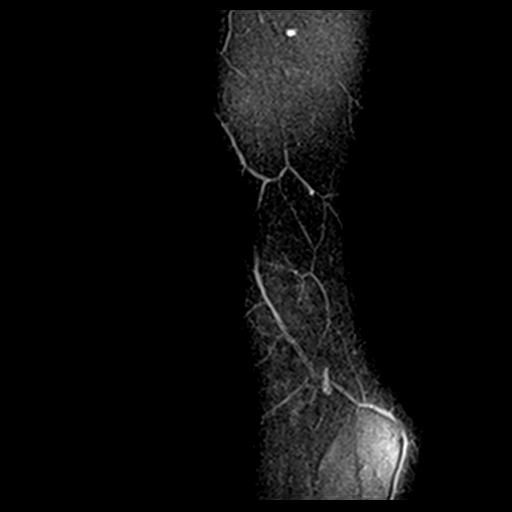

[Series 10: T2 fat-sat · sagittal · 3.0mm · 0.29mm/px · 7 of 27 slices shown (2 of 2)]
[im 1/27]
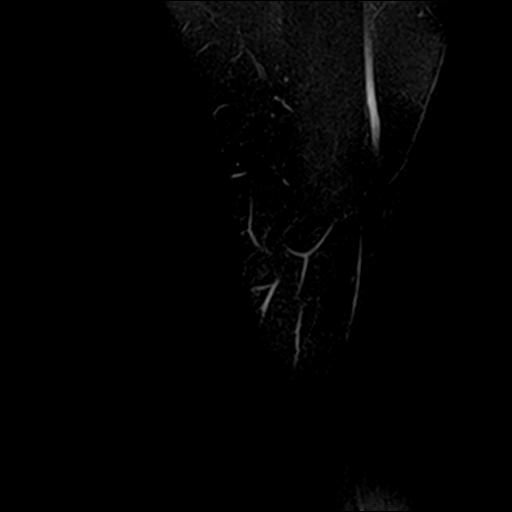
[im 5/27]
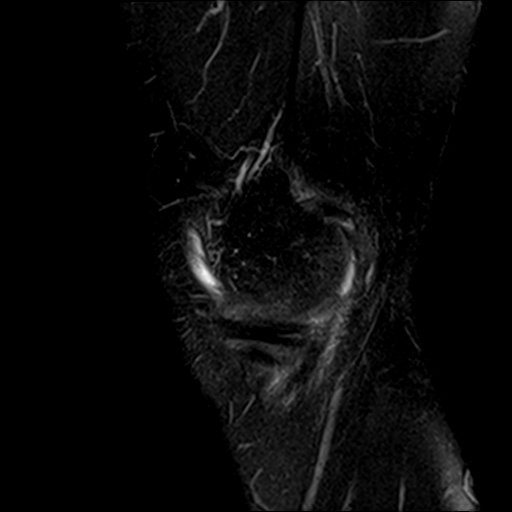
[im 9/27]
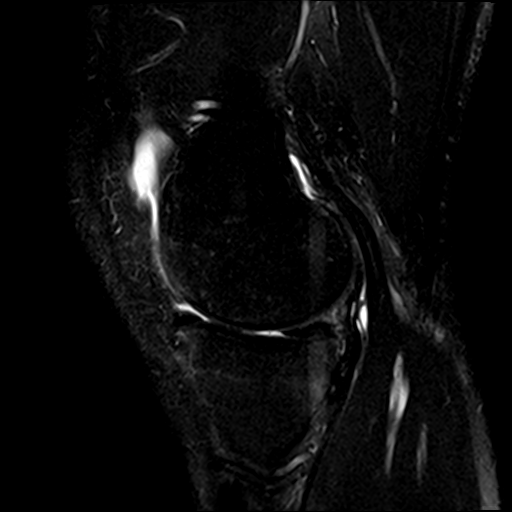
[im 14/27]
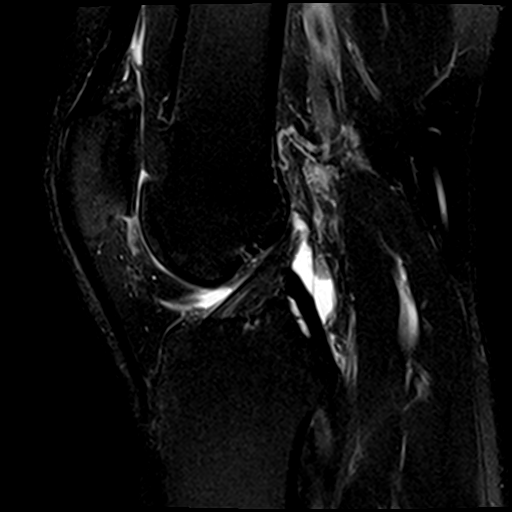
[im 18/27]
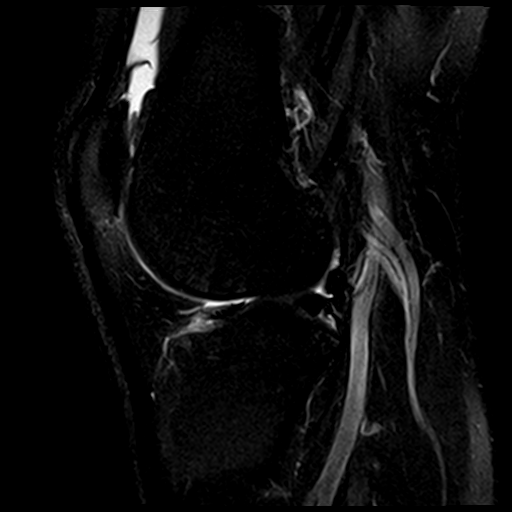
[im 22/27]
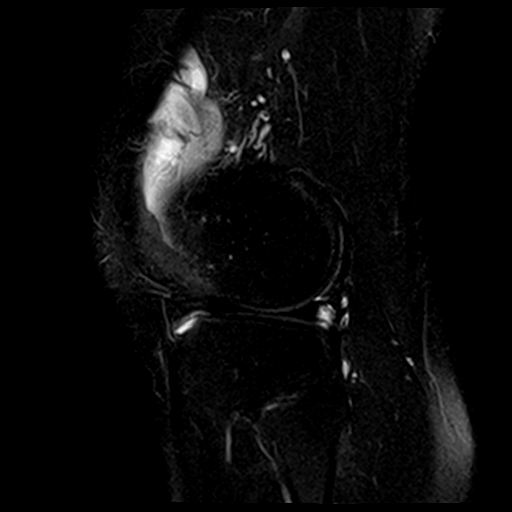
[im 27/27]
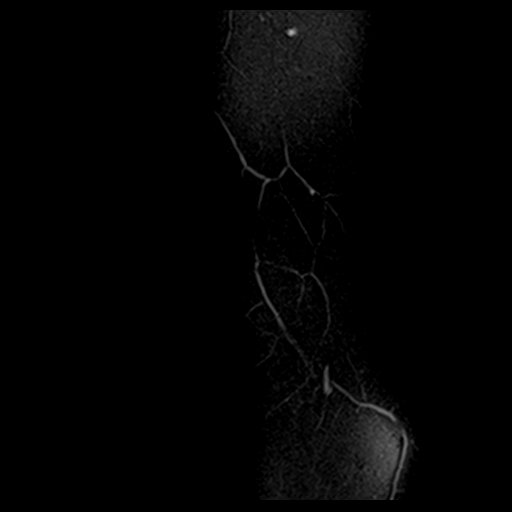

[22 of 40 positions shown; findings below may reference images not displayed]

FINDINGS: MENISCI

Medial meniscus: Small tear of the free edge of the posterior horn
of medial meniscus. Focal increased signal in the posterior horn of
the medial meniscus consistent with degeneration.

Lateral meniscus:  Intact.

LIGAMENTS

Cruciates:  Intact ACL and PCL.

Collaterals: Small amount of fluid superficial to the MCL with the
MCL intact which may reflect mild grade 1 MCL strain. Lateral
collateral ligament complex is intact.

CARTILAGE

Patellofemoral:  No chondral defect.

Medial: High-grade partial-thickness cartilage loss of the medial
femoral condyle and medial tibial plateau with mild subchondral
reactive marrow edema at the periphery of the medial femorotibial
compartment.

Lateral:  No focal chondral defect.

Joint: Moderate joint effusion. Minimal edema in Hoffa's fat. No
plical thickening.

Popliteal Fossa:  Small Baker's cyst.  Intact popliteus tendon.

Extensor Mechanism: Intact quadriceps tendon. Intact patellar
tendon. Intact medial patellar retinaculum. Intact lateral patellar
retinaculum. Intact MPFL.

Bones:  No acute osseous abnormality.  No aggressive osseous lesion.

Other: No fluid collection or hematoma.  Muscles are normal.
IMPRESSION: 1. Small tear of the free edge of the posterior horn of medial
meniscus. Focal increased signal in the posterior horn of the medial
meniscus consistent with degeneration.
2. High-grade partial-thickness cartilage loss of the medial femoral
condyle and medial tibial plateau with mild subchondral reactive
marrow edema at the periphery of the medial femorotibial
compartment.

## 2019-11-07 DIAGNOSIS — Z20828 Contact with and (suspected) exposure to other viral communicable diseases: Secondary | ICD-10-CM | POA: Diagnosis not present

## 2019-11-08 DIAGNOSIS — Z20828 Contact with and (suspected) exposure to other viral communicable diseases: Secondary | ICD-10-CM | POA: Diagnosis not present

## 2019-11-08 DIAGNOSIS — Z03818 Encounter for observation for suspected exposure to other biological agents ruled out: Secondary | ICD-10-CM | POA: Diagnosis not present

## 2020-07-03 ENCOUNTER — Other Ambulatory Visit: Payer: Self-pay

## 2020-07-04 ENCOUNTER — Ambulatory Visit (INDEPENDENT_AMBULATORY_CARE_PROVIDER_SITE_OTHER): Payer: No Typology Code available for payment source | Admitting: Family Medicine

## 2020-07-04 ENCOUNTER — Encounter: Payer: Self-pay | Admitting: Family Medicine

## 2020-07-04 VITALS — BP 122/72 | HR 54 | Temp 97.6°F | Ht 72.0 in | Wt 174.0 lb

## 2020-07-04 DIAGNOSIS — Z0000 Encounter for general adult medical examination without abnormal findings: Secondary | ICD-10-CM | POA: Diagnosis not present

## 2020-07-04 NOTE — Patient Instructions (Signed)
Preventive Care 41-40 Years Old, Male Preventive care refers to lifestyle choices and visits with your health care provider that can promote health and wellness. This includes:  A yearly physical exam. This is also called an annual well check.  Regular dental and eye exams.  Immunizations.  Screening for certain conditions.  Healthy lifestyle choices, such as eating a healthy diet, getting regular exercise, not using drugs or products that contain nicotine and tobacco, and limiting alcohol use. What can I expect for my preventive care visit? Physical exam Your health care provider will check:  Height and weight. These may be used to calculate body mass index (BMI), which is a measurement that tells if you are at a healthy weight.  Heart rate and blood pressure.  Your skin for abnormal spots. Counseling Your health care provider may ask you questions about:  Alcohol, tobacco, and drug use.  Emotional well-being.  Home and relationship well-being.  Sexual activity.  Eating habits.  Work and work Statistician. What immunizations do I need?  Influenza (flu) vaccine  This is recommended every year. Tetanus, diphtheria, and pertussis (Tdap) vaccine  You may need a Td booster every 10 years. Varicella (chickenpox) vaccine  You may need this vaccine if you have not already been vaccinated. Zoster (shingles) vaccine  You may need this after age 64. Measles, mumps, and rubella (MMR) vaccine  You may need at least one dose of MMR if you were born in 1957 or later. You may also need a second dose. Pneumococcal conjugate (PCV13) vaccine  You may need this if you have certain conditions and were not previously vaccinated. Pneumococcal polysaccharide (PPSV23) vaccine  You may need one or two doses if you smoke cigarettes or if you have certain conditions. Meningococcal conjugate (MenACWY) vaccine  You may need this if you have certain conditions. Hepatitis A  vaccine  You may need this if you have certain conditions or if you travel or work in places where you may be exposed to hepatitis A. Hepatitis B vaccine  You may need this if you have certain conditions or if you travel or work in places where you may be exposed to hepatitis B. Haemophilus influenzae type b (Hib) vaccine  You may need this if you have certain risk factors. Human papillomavirus (HPV) vaccine  If recommended by your health care provider, you may need three doses over 6 months. You may receive vaccines as individual doses or as more than one vaccine together in one shot (combination vaccines). Talk with your health care provider about the risks and benefits of combination vaccines. What tests do I need? Blood tests  Lipid and cholesterol levels. These may be checked every 5 years, or more frequently if you are over 60 years old.  Hepatitis C test.  Hepatitis B test. Screening  Lung cancer screening. You may have this screening every year starting at age 43 if you have a 30-pack-year history of smoking and currently smoke or have quit within the past 15 years.  Prostate cancer screening. Recommendations will vary depending on your family history and other risks.  Colorectal cancer screening. All adults should have this screening starting at age 72 and continuing until age 2. Your health care provider may recommend screening at age 14 if you are at increased risk. You will have tests every 1-10 years, depending on your results and the type of screening test.  Diabetes screening. This is done by checking your blood sugar (glucose) after you have not eaten  for a while (fasting). You may have this done every 1-3 years.  Sexually transmitted disease (STD) testing. Follow these instructions at home: Eating and drinking  Eat a diet that includes fresh fruits and vegetables, whole grains, lean protein, and low-fat dairy products.  Take vitamin and mineral supplements as  recommended by your health care provider.  Do not drink alcohol if your health care provider tells you not to drink.  If you drink alcohol: ? Limit how much you have to 0-2 drinks a day. ? Be aware of how much alcohol is in your drink. In the [place] one drink equals one 12 oz bottle of beer (355 mL), one 5 oz glass of wine (148 mL), or one 1 oz glass of hard liquor (44 mL). Lifestyle  Take daily care of your teeth and gums.  Stay active. Exercise for at least 30 minutes on 5 or more days each week.  Do not use any products that contain nicotine or tobacco, such as cigarettes, e-cigarettes, and chewing tobacco. If you need help quitting, ask your health care provider.  If you are sexually active, practice safe sex. Use a condom or other form of protection to prevent STIs (sexually transmitted infections).  Talk with your health care provider about taking a low-dose aspirin every day starting at age 50. What's next?  Go to your health care provider once a year for a well check visit.  Ask your health care provider how often you should have your eyes and teeth checked.  Stay up to date on all vaccines. This information is not intended to replace advice given to you by your health care provider. Make sure you discuss any questions you have with your health care provider. Document Revised: 10/21/2018 Document Reviewed: 10/21/2018 Elsevier Patient Education  2020 Elsevier Inc.  Health Maintenance, Male Adopting a healthy lifestyle and getting preventive care are important in promoting health and wellness. Ask your health care provider about:  The right schedule for you to have regular tests and exams.  Things you can do on your own to prevent diseases and keep yourself healthy. What should I know about diet, weight, and exercise? Eat a healthy diet   Eat a diet that includes plenty of vegetables, fruits, low-fat dairy products, and lean protein.  Do not eat a lot of foods that are  high in solid fats, added sugars, or sodium. Maintain a healthy weight Body mass index (BMI) is a measurement that can be used to identify possible weight problems. It estimates body fat based on height and weight. Your health care provider can help determine your BMI and help you achieve or maintain a healthy weight. Get regular exercise Get regular exercise. This is one of the most important things you can do for your health. Most adults should:  Exercise for at least 150 minutes each week. The exercise should increase your heart rate and make you sweat (moderate-intensity exercise).  Do strengthening exercises at least twice a week. This is in addition to the moderate-intensity exercise.  Spend less time sitting. Even light physical activity can be beneficial. Watch cholesterol and blood lipids Have your blood tested for lipids and cholesterol at 40 years of age, then have this test every 5 years. You may need to have your cholesterol levels checked more often if:  Your lipid or cholesterol levels are high.  You are older than 40 years of age.  You are at high risk for heart disease. What should I know about   cancer screening? Many types of cancers can be detected early and may often be prevented. Depending on your health history and family history, you may need to have cancer screening at various ages. This may include screening for:  Colorectal cancer.  Prostate cancer.  Skin cancer.  Lung cancer. What should I know about heart disease, diabetes, and high blood pressure? Blood pressure and heart disease  High blood pressure causes heart disease and increases the risk of stroke. This is more likely to develop in people who have high blood pressure readings, are of African descent, or are overweight.  Talk with your health care provider about your target blood pressure readings.  Have your blood pressure checked: ? Every 3-5 years if you are 18-39 years of age. ? Every year  if you are 40 years old or older.  If you are between the ages of 65 and 75 and are a current or former smoker, ask your health care provider if you should have a one-time screening for abdominal aortic aneurysm (AAA). Diabetes Have regular diabetes screenings. This checks your fasting blood sugar level. Have the screening done:  Once every three years after age 45 if you are at a normal weight and have a low risk for diabetes.  More often and at a younger age if you are overweight or have a high risk for diabetes. What should I know about preventing infection? Hepatitis B If you have a higher risk for hepatitis B, you should be screened for this virus. Talk with your health care provider to find out if you are at risk for hepatitis B infection. Hepatitis C Blood testing is recommended for:  Everyone born from 1945 through 1965.  Anyone with known risk factors for hepatitis C. Sexually transmitted infections (STIs)  You should be screened each year for STIs, including gonorrhea and chlamydia, if: ? You are sexually active and are younger than 40 years of age. ? You are older than 40 years of age and your health care provider tells you that you are at risk for this type of infection. ? Your sexual activity has changed since you were last screened, and you are at increased risk for chlamydia or gonorrhea. Ask your health care provider if you are at risk.  Ask your health care provider about whether you are at high risk for HIV. Your health care provider may recommend a prescription medicine to help prevent HIV infection. If you choose to take medicine to prevent HIV, you should first get tested for HIV. You should then be tested every 3 months for as long as you are taking the medicine. Follow these instructions at home: Lifestyle  Do not use any products that contain nicotine or tobacco, such as cigarettes, e-cigarettes, and chewing tobacco. If you need help quitting, ask your health care  provider.  Do not use street drugs.  Do not share needles.  Ask your health care provider for help if you need support or information about quitting drugs. Alcohol use  Do not drink alcohol if your health care provider tells you not to drink.  If you drink alcohol: ? Limit how much you have to 0-2 drinks a day. ? Be aware of how much alcohol is in your drink. In the [place] one drink equals one 12 oz bottle of beer (355 mL), one 5 oz glass of wine (148 mL), or one 1 oz glass of hard liquor (44 mL). General instructions  Schedule regular health, dental, and eye exams.    Stay current with your vaccines.  Tell your health care provider if: ? You often feel depressed. ? You have ever been abused or do not feel safe at home. Summary  Adopting a healthy lifestyle and getting preventive care are important in promoting health and wellness.  Follow your health care provider's instructions about healthy diet, exercising, and getting tested or screened for diseases.  Follow your health care provider's instructions on monitoring your cholesterol and blood pressure. This information is not intended to replace advice given to you by your health care provider. Make sure you discuss any questions you have with your health care provider. Document Revised: 10/20/2018 Document Reviewed: 10/20/2018 Elsevier Patient Education  2020 Else[employer] Inc.  Preventing High Cholesterol Cholesterol is a white, waxy substance similar to fat that the human body needs to help build cells. The liver makes all the cholesterol that a person's body needs. Having high cholesterol (hypercholesterolemia) increases a person's risk for heart disease and stroke. Extra (excess) cholesterol comes from the food the person eats. High cholesterol can often be prevented with diet and lifestyle changes. If you already have high cholesterol, you can control it with diet and lifestyle changes and with medicine. How can high cholesterol  affect me? If you have high cholesterol, deposits (plaques) may build up on the walls of your arteries. The arteries are the blood vessels that carry blood away from your heart. Plaques make the arteries narrower and stiffer. This can limit or block blood flow and cause blood clots to form. Blood clots:  Are tiny balls of cells that form in your blood.  Can move to the heart or brain, causing a heart attack or stroke. Plaques in arteries greatly increase your risk for heart attack and stroke.Making diet and lifestyle changes can reduce your risk for these conditions that may threaten your life. What can increase my risk? This condition is more likely to develop in people who:  Eat foods that are high in saturated fat or cholesterol. Saturated fat is mostly found in: ? Foods that contain animal fat, such as red meat and some dairy products. ? Certain fatty foods made from plants, such as tropical oils.  Are overweight.  Are not getting enough exercise.  Have a family history of high cholesterol. What actions can I take to prevent this? Nutrition   Eat less saturated fat.  Avoid trans fats (partially hydrogenated oils). These are often found in margarine and in some baked goods, fried foods, and snacks bought in packages.  Avoid precooked or cured meat, such as sausages or meat loaves.  Avoid foods and drinks that have added sugars.  Eat more fruits, vegetables, and whole grains.  Choose healthy sources of protein, such as fish, poultry, lean cuts of red meat, beans, peas, lentils, and nuts.  Choose healthy sources of fat, such as: ? Nuts. ? Vegetable oils, especially olive oil. ? Fish that have healthy fats (omega-3 fatty acids), such as mackerel or salmon. The items listed above may not be a complete list of recommended foods and beverages. Contact a dietitian for more information. Lifestyle  Lose weight if you are overweight. Losing 5-10 lb (2.3-4.5 kg) can help prevent or  control high cholesterol. It can also lower your risk for diabetes and high blood pressure. Ask your health care provider to help you with a diet and exercise plan to lose weight safely.  Do not use any products that contain nicotine or tobacco, such as cigarettes, e-cigarettes, and chewing tobacco.   If you need help quitting, ask your health care provider.  Limit your alcohol intake. ? Do not drink alcohol if:  Your health care provider tells you not to drink.  You are pregnant, may be pregnant, or are planning to become pregnant. ? If you drink alcohol:  Limit how much you use to:  0-1 drink a day for women.  0-2 drinks a day for men.  Be aware of how much alcohol is in your drink. In the [place] one drink equals one 12 oz bottle of beer (355 mL), one 5 oz glass of wine (148 mL), or one 1 oz glass of hard liquor (44 mL). Activity   Get enough exercise. Each week, do at least 150 minutes of exercise that takes a medium level of effort (moderate-intensity exercise). ? This is exercise that:  Makes your heart beat faster and makes you breathe harder than usual.  Allows you to still be able to talk. ? You could exercise in short sessions several times a day or longer sessions a few times a week. For example, on 5 days each week, you could walk fast or ride your bike 3 times a day for 10 minutes each time.  Do exercises as told by your health care provider. Medicines  In addition to diet and lifestyle changes, your health care provider may recommend medicines to help lower cholesterol. This may be a medicine to lower the amount of cholesterol your liver makes. You may need medicine if: ? Diet and lifestyle changes do not lower your cholesterol enough. ? You have high cholesterol and other risk factors for heart disease or stroke.  Take over-the-counter and prescription medicines only as told by your health care provider. General information  Manage your risk factors for high  cholesterol. Talk with your health care provider about all your risk factors and how to lower your risk.  Manage other conditions that you have, such as diabetes or high blood pressure (hypertension).  Have blood tests to check your cholesterol levels at regular points in time as told by your health care provider.  Keep all follow-up visits as told by your health care provider. This is important. Where to find more information  American Heart Association: www.heart.org  National Heart, Lung, and Blood Institute: www.nhlbi.nih.gov Summary  High cholesterol increases your risk for heart disease and stroke. By keeping your cholesterol level low, you can reduce your risk for these conditions.  High cholesterol can often be prevented with diet and lifestyle changes.  Work with your health care provider to manage your risk factors, and have your blood tested regularly. This information is not intended to replace advice given to you by your health care provider. Make sure you discuss any questions you have with your health care provider. Document Revised: 02/18/2019 Document Reviewed: 07/05/2016 Elsevier Patient Education  2020 Elsevier Inc.  

## 2020-07-04 NOTE — Progress Notes (Signed)
Established Patient Office Visit  Subjective:  Patient ID: Alvin Smith, male    DOB: 1980/11/12  Age: 39 y.o. MRN: [id number]  CC:  Chief Complaint  Patient presents with  . Establish Care    CPE, No concerns.     HPI Alvin Smith presents for a routine physical.  He has been doing well.  Continues to live at home with his wife and kids.  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  He is quite active on his job.  He has regular health care.  Remains on the fence about the Covid vaccine.  Denies any major health changes since his last visit.  Living with a torn meniscus in his left knee.  On occasion feels as though it is giving way but it does not.  Uses Voltaren as needed.  Past Medical History:  Diagnosis Date  . Depression     No past surgical history on file.  Family History  Problem Relation Age of Onset  . Heart attack Father   . Hyperlipidemia Father   . Asthma Brother   . Asthma Son     Social History   Socioeconomic History  . Marital status: Single    Spouse name: Not on file  . Number of children: Not on file  . Years of education: Not on file  . Highest education level: Not on file  Occupational History  . Not on file  Tobacco Use  . Smoking status: Former Smoker    Quit date: 2004    Years since quitting: 17.6  . Smokeless tobacco: Never Used  Vaping Use  . Vaping Use: Never used  Substance and Sexual Activity  . Alcohol use: Not Currently  . Drug use: Not Currently  . Sexual activity: Not on file  Other Topics Concern  . Not on file  Social History Narrative  . Not on file   Social Determinants of Health   Financial Resource Strain:   . Difficulty of Paying Living Expenses: Not on file  Food Insecurity:   . Worried About Programme researcher, broadcasting/film/video in the Last Year: Not on file  . Ran Out of Food in the Last Year: Not on file  Transportation Needs:   . Lack of Transportation (Medical): Not on file  . Lack of Transportation (Non-Medical):  Not on file  Physical Activity:   . Days of Exercise per Week: Not on file  . Minutes of Exercise per Session: Not on file  Stress:   . Feeling of Stress : Not on file  Social Connections:   . Frequency of Communication with Friends and Family: Not on file  . Frequency of Social Gatherings with Friends and Family: Not on file  . Attends Religious Services: Not on file  . Active Member of Clubs or Organizations: Not on file  . Attends Banker Meetings: Not on file  . Marital Status: Not on file  Intimate Partner Violence:   . Fear of Current or Ex-Partner: Not on file  . Emotionally Abused: Not on file  . Physically Abused: Not on file  . Sexually Abused: Not on file    Outpatient Medications Prior to Visit  Medication Sig Dispense Refill  . diclofenac sodium (VOLTAREN) 1 % GEL APPLY 4 G TOPICALLY 4 (FOUR) TIMES DAILY. 500 g 0   No facility-administered medications prior to visit.    No Known Allergies  ROS Review of Systems  Constitutional: Negative.  HENT: Negative.   Eyes: Negative for photophobia and visual disturbance.  Respiratory: Negative.   Cardiovascular: Negative.   Gastrointestinal: Negative.   Genitourinary: Negative.   Musculoskeletal: Positive for arthralgias.  Allergic/Immunologic: Negative for immunocompromised state.  Neurological: Negative for light-headedness and numbness.  Hematological: Does not bruise/bleed easily.  Psychiatric/Behavioral: Negative.       Objective:    Physical Exam Vitals and nursing note reviewed.  Constitutional:      General: He is not in acute distress.    Appearance: Normal appearance. He is normal weight. He is not ill-appearing, toxic-appearing or diaphoretic.  HENT:     Head: Normocephalic and atraumatic.     Right Ear: Tympanic membrane, ear canal and external ear normal.     Left Ear: Tympanic membrane, ear canal and external ear normal.     Nose: Nose normal. No congestion or rhinorrhea.  Eyes:       General:        Right eye: No discharge.        Left eye: No discharge.     Conjunctiva/sclera: Conjunctivae normal.     Pupils: Pupils are equal, round, and reactive to light.  Cardiovascular:     Rate and Rhythm: Normal rate and regular rhythm.  Pulmonary:     Effort: Pulmonary effort is normal.     Breath sounds: Normal breath sounds.  Abdominal:     General: Abdomen is flat. Bowel sounds are normal. There is no distension.     Palpations: Abdomen is soft. There is no mass.     Tenderness: There is no abdominal tenderness. There is no guarding or rebound.     Hernia: No hernia is present. There is no hernia in the left inguinal area or right inguinal area.  Genitourinary:    Penis: Normal and circumcised. No hypospadias, erythema, tenderness, discharge, swelling or lesions.      Testes:        Right: Mass, tenderness or swelling not present. Right testis is descended.        Left: Mass, tenderness or swelling not present.     Epididymis:     Right: Not inflamed or enlarged.     Left: Not inflamed or enlarged.  Musculoskeletal:     Cervical back: No rigidity or tenderness.     Right lower leg: No edema.     Left lower leg: No edema.  Lymphadenopathy:     Cervical: No cervical adenopathy.     Lower Body: No right inguinal adenopathy. No left inguinal adenopathy.  Skin:    General: Skin is warm and dry.  Neurological:     Mental Status: He is alert and oriented to person, place, and time.  Psychiatric:        Mood and Affect: Mood normal.        Behavior: Behavior normal.     BP 122/72   Pulse (!) 54   Temp 97.6 F (36.4 C) (Tympanic)   Ht 6' (1.829 m)   Wt 174 lb (78.9 kg)   SpO2 96%   BMI 23.60 kg/m  Wt Readings from Last 3 Encounters:  07/04/20 174 lb (78.9 kg)  10/19/18 193 lb (87.5 kg)  08/26/18 193 lb (87.5 kg)     Health Maintenance Due  Topic Date Due  . Hepatitis C Screening  Never done  . INFLUENZA VACCINE  06/10/2020    There are no  preventive care reminders to display for this patient.  Lab Results  Component  Value Date   TSH 1.17 01/28/2018   Lab Results  Component Value Date   WBC 3.9 (L) 01/28/2018   HGB 15.4 01/28/2018   HCT 45.1 01/28/2018   MCV 93.2 01/28/2018   PLT 268.0 01/28/2018   Lab Results  Component Value Date   NA 138 01/28/2018   K 4.3 01/28/2018   CO2 28 01/28/2018   GLUCOSE 90 01/28/2018   BUN 23 01/28/2018   CREATININE 1.18 01/28/2018   BILITOT 0.5 01/28/2018   AST 28 01/28/2018   ALT 27 01/28/2018   PROT 7.2 01/28/2018   CALCIUM 9.6 01/28/2018   Lab Results  Component Value Date   CHOL 183 01/28/2018   Lab Results  Component Value Date   HDL 39.70 01/28/2018   Lab Results  Component Value Date   LDLCALC 121 (H) 01/28/2018   Lab Results  Component Value Date   TRIG 108.0 01/28/2018   Lab Results  Component Value Date   CHOLHDL 5 01/28/2018   No results found for: HGBA1C    Assessment & Plan:   Problem List Items Addressed This Visit      Other   Healthcare maintenance - Primary   Relevant Orders   CBC   Comprehensive metabolic panel   Lipid panel   Urinalysis, Routine w reflex microscopic      No orders of the defined types were placed in this encounter.   Follow-up: Return in about 1 year (around 07/04/2021), or if symptoms worsen or fail to improve, for and return fasting for ordered blood work. .  Given information on health maintenance disease prevention and preventing high cholesterol.  Return fasting for above ordered blood work follow-up in 1 year.  Mliss Sax, MD

## 2021-01-07 DIAGNOSIS — M9901 Segmental and somatic dysfunction of cervical region: Secondary | ICD-10-CM | POA: Diagnosis not present

## 2021-01-07 DIAGNOSIS — M9902 Segmental and somatic dysfunction of thoracic region: Secondary | ICD-10-CM | POA: Diagnosis not present

## 2021-01-07 DIAGNOSIS — M9905 Segmental and somatic dysfunction of pelvic region: Secondary | ICD-10-CM | POA: Diagnosis not present

## 2021-01-07 DIAGNOSIS — M9903 Segmental and somatic dysfunction of lumbar region: Secondary | ICD-10-CM | POA: Diagnosis not present

## 2021-01-14 DIAGNOSIS — M9901 Segmental and somatic dysfunction of cervical region: Secondary | ICD-10-CM | POA: Diagnosis not present

## 2021-01-14 DIAGNOSIS — M9903 Segmental and somatic dysfunction of lumbar region: Secondary | ICD-10-CM | POA: Diagnosis not present

## 2021-01-14 DIAGNOSIS — M9902 Segmental and somatic dysfunction of thoracic region: Secondary | ICD-10-CM | POA: Diagnosis not present

## 2021-01-14 DIAGNOSIS — M9905 Segmental and somatic dysfunction of pelvic region: Secondary | ICD-10-CM | POA: Diagnosis not present

## 2021-01-17 DIAGNOSIS — M9903 Segmental and somatic dysfunction of lumbar region: Secondary | ICD-10-CM | POA: Diagnosis not present

## 2021-01-17 DIAGNOSIS — M9905 Segmental and somatic dysfunction of pelvic region: Secondary | ICD-10-CM | POA: Diagnosis not present

## 2021-01-17 DIAGNOSIS — M9901 Segmental and somatic dysfunction of cervical region: Secondary | ICD-10-CM | POA: Diagnosis not present

## 2021-01-17 DIAGNOSIS — M9902 Segmental and somatic dysfunction of thoracic region: Secondary | ICD-10-CM | POA: Diagnosis not present

## 2021-09-24 ENCOUNTER — Other Ambulatory Visit: Payer: Self-pay

## 2021-09-25 ENCOUNTER — Encounter: Payer: Self-pay | Admitting: Family Medicine

## 2021-09-25 ENCOUNTER — Ambulatory Visit (INDEPENDENT_AMBULATORY_CARE_PROVIDER_SITE_OTHER): Payer: No Typology Code available for payment source | Admitting: Family Medicine

## 2021-09-25 VITALS — BP 122/68 | HR 50 | Temp 97.4°F | Ht 71.0 in | Wt 189.4 lb

## 2021-09-25 DIAGNOSIS — Z23 Encounter for immunization: Secondary | ICD-10-CM

## 2021-09-25 DIAGNOSIS — Z0000 Encounter for general adult medical examination without abnormal findings: Secondary | ICD-10-CM | POA: Diagnosis not present

## 2021-09-25 LAB — COMPREHENSIVE METABOLIC PANEL
ALT: 24 U/L (ref 0–53)
AST: 25 U/L (ref 0–37)
Albumin: 4.5 g/dL (ref 3.5–5.2)
Alkaline Phosphatase: 61 U/L (ref 39–117)
BUN: 17 mg/dL (ref 6–23)
CO2: 28 mEq/L (ref 19–32)
Calcium: 9.3 mg/dL (ref 8.4–10.5)
Chloride: 105 mEq/L (ref 96–112)
Creatinine, Ser: 1.09 mg/dL (ref 0.40–1.50)
GFR: 84.44 mL/min (ref >60.00)
Glucose, Bld: 79 mg/dL (ref 70–99)
Potassium: 4.1 mEq/L (ref 3.5–5.1)
Sodium: 139 mEq/L (ref 135–145)
Total Bilirubin: 0.6 mg/dL (ref 0.2–1.2)
Total Protein: 6.6 g/dL (ref 6.0–8.3)

## 2021-09-25 LAB — CBC
HCT: 41.7 % (ref 39.0–52.0)
Hemoglobin: 13.6 g/dL (ref 13.0–17.0)
MCHC: 32.6 g/dL (ref 30.0–36.0)
MCV: 91.7 fl (ref 78.0–100.0)
Platelets: 275 10*3/uL (ref 150.0–400.0)
RBC: 4.55 Mil/uL (ref 4.22–5.81)
RDW: 13.4 % (ref 11.5–15.5)
WBC: 5.2 10*3/uL (ref 4.0–10.5)

## 2021-09-25 LAB — LIPID PANEL
Cholesterol: 227 mg/dL — ABNORMAL HIGH (ref 0–200)
HDL: 37.7 mg/dL — ABNORMAL LOW (ref >39.00)
NonHDL: 188.94
Total CHOL/HDL Ratio: 6
Triglycerides: 228 mg/dL — ABNORMAL HIGH (ref 0.0–149.0)
VLDL: 45.6 mg/dL — ABNORMAL HIGH (ref 0.0–40.0)

## 2021-09-25 LAB — URINALYSIS, ROUTINE W REFLEX MICROSCOPIC
Bilirubin Urine: NEGATIVE
Hgb urine dipstick: NEGATIVE
Ketones, ur: NEGATIVE
Leukocytes,Ua: NEGATIVE
Nitrite: NEGATIVE
RBC / HPF: NONE SEEN (ref 0–?)
Specific Gravity, Urine: 1.02 (ref 1.000–1.030)
Total Protein, Urine: NEGATIVE
Urine Glucose: NEGATIVE
Urobilinogen, UA: 0.2 (ref 0.0–1.0)
pH: 6 (ref 5.0–8.0)

## 2021-09-25 LAB — LDL CHOLESTEROL, DIRECT: Direct LDL: 128 mg/dL

## 2021-09-25 NOTE — Progress Notes (Signed)
Established Patient Office Visit  Subjective:  Patient ID: Alvin Smith, male    DOB: 1981/06/16  Age: 40 y.o. MRN: [id number]  CC:  Chief Complaint  Patient presents with   Annual Exam    CPE, no concerns patient fasting.     HPI Alvin Smith presents for a health check and physical exam.  Continues to enjoy good health.  Continues managing a warehouse.  He does not smoke, drink alcohol or use illicit drugs.  Gets at least 10,000 steps daily on his job.  Lives with his wife family and kids.  Currently has no health concerns.  Past Medical History:  Diagnosis Date   Depression     History reviewed. No pertinent surgical history.  Family History  Problem Relation Age of Onset   Healthy Mother    Heart attack Father    Hyperlipidemia Father    Asthma Brother    Asthma Son    Healthy Paternal Grandmother     Social History   Socioeconomic History   Marital status: Single    Spouse name: Not on file   Number of children: Not on file   Years of education: Not on file   Highest education level: Not on file  Occupational History   Not on file  Tobacco Use   Smoking status: Former    Types: Cigarettes    Quit date: 2004    Years since quitting: 18.8   Smokeless tobacco: Never  Vaping Use   Vaping Use: Never used  Substance and Sexual Activity   Alcohol use: Not Currently   Drug use: Not Currently   Sexual activity: Yes  Other Topics Concern   Not on file  Social History Narrative   Not on file   Social Determinants of Health   Financial Resource Strain: Not on file  Food Insecurity: Not on file  Transportation Needs: Not on file  Physical Activity: Not on file  Stress: Not on file  Social Connections: Not on file  Intimate Partner Violence: Not on file    Outpatient Medications Prior to Visit  Medication Sig Dispense Refill   diclofenac sodium (VOLTAREN) 1 % GEL APPLY 4 G TOPICALLY 4 (FOUR) TIMES DAILY. 500 g 0   No facility-administered  medications prior to visit.    No Known Allergies  ROS Review of Systems  Constitutional:  Negative for chills, diaphoresis, fatigue, fever and unexpected weight change.  HENT: Negative.    Eyes:  Negative for photophobia and visual disturbance.  Respiratory: Negative.    Cardiovascular: Negative.   Gastrointestinal: Negative.   Endocrine: Negative for polyphagia and polyuria.  Genitourinary: Negative.   Musculoskeletal:  Negative for gait problem and joint swelling.  Skin:  Negative for pallor and rash.  Neurological:  Negative for speech difficulty and weakness.  Psychiatric/Behavioral: Negative.       Objective:    Physical Exam Vitals and nursing note reviewed.  Constitutional:      General: He is not in acute distress.    Appearance: Normal appearance. He is normal weight. He is not ill-appearing, toxic-appearing or diaphoretic.  HENT:     Head: Normocephalic and atraumatic.     Right Ear: Tympanic membrane, ear canal and external ear normal.     Left Ear: Tympanic membrane, ear canal and external ear normal.     Mouth/Throat:     Mouth: Mucous membranes are moist.     Pharynx: Oropharynx is clear. No oropharyngeal exudate or posterior oropharyngeal  erythema.  Eyes:     General: No scleral icterus.       Right eye: No discharge.        Left eye: No discharge.     Extraocular Movements: Extraocular movements intact.     Conjunctiva/sclera: Conjunctivae normal.     Pupils: Pupils are equal, round, and reactive to light.  Cardiovascular:     Rate and Rhythm: Normal rate and regular rhythm.  Pulmonary:     Effort: Pulmonary effort is normal.     Breath sounds: Normal breath sounds.  Abdominal:     General: Abdomen is flat. Bowel sounds are normal. There is no distension.     Palpations: Abdomen is soft. There is no mass.     Tenderness: There is no abdominal tenderness. There is no guarding or rebound.     Hernia: No hernia is present. There is no hernia in the  left inguinal area or right inguinal area.  Genitourinary:    Penis: Circumcised. No hypospadias, erythema, tenderness, discharge, swelling or lesions.      Testes:        Right: Mass, tenderness or swelling not present. Right testis is descended.        Left: Mass, tenderness or swelling not present. Left testis is descended.     Epididymis:     Right: Not inflamed or enlarged.     Left: Not inflamed or enlarged.  Musculoskeletal:     Cervical back: No rigidity or tenderness.     Right lower leg: No edema.     Left lower leg: No edema.  Lymphadenopathy:     Cervical: No cervical adenopathy.     Lower Body: No right inguinal adenopathy. No left inguinal adenopathy.  Skin:    General: Skin is warm and dry.  Neurological:     Mental Status: He is alert and oriented to person, place, and time.  Psychiatric:        Mood and Affect: Mood normal.        Behavior: Behavior normal.    BP 122/68 (BP Location: Right Arm, Patient Position: Sitting, Cuff Size: Normal)   Pulse (!) 50   Temp (!) 97.4 F (36.3 C) (Temporal)   Ht 5\' 11"  (1.803 m) Comment: 5'11 1/2  Wt 189 lb 6.4 oz (85.9 kg)   SpO2 97%   BMI 26.42 kg/m  Wt Readings from Last 3 Encounters:  09/25/21 189 lb 6.4 oz (85.9 kg)  07/04/20 174 lb (78.9 kg)  10/19/18 193 lb (87.5 kg)     Health Maintenance Due  Topic Date Due   Hepatitis C Screening  Never done   TETANUS/TDAP  08/24/2021    There are no preventive care reminders to display for this patient.  Lab Results  Component Value Date   TSH 1.17 01/28/2018   Lab Results  Component Value Date   WBC 3.9 (L) 01/28/2018   HGB 15.4 01/28/2018   HCT 45.1 01/28/2018   MCV 93.2 01/28/2018   PLT 268.0 01/28/2018   Lab Results  Component Value Date   NA 138 01/28/2018   K 4.3 01/28/2018   CO2 28 01/28/2018   GLUCOSE 90 01/28/2018   BUN 23 01/28/2018   CREATININE 1.18 01/28/2018   BILITOT 0.5 01/28/2018   AST 28 01/28/2018   ALT 27 01/28/2018   PROT 7.2  01/28/2018   CALCIUM 9.6 01/28/2018   Lab Results  Component Value Date   CHOL 183 01/28/2018   Lab Results  Component Value Date   HDL 39.70 01/28/2018   Lab Results  Component Value Date   LDLCALC 121 (H) 01/28/2018   Lab Results  Component Value Date   TRIG 108.0 01/28/2018   Lab Results  Component Value Date   CHOLHDL 5 01/28/2018   No results found for: HGBA1C    Assessment & Plan:   Problem List Items Addressed This Visit       Other   Healthcare maintenance - Primary   Relevant Orders   Comprehensive metabolic panel   CBC   Lipid panel   Urinalysis, Routine w reflex microscopic   Flu vaccine need   Relevant Orders   Flu Vaccine QUAD 6+ mos PF IM (Fluarix Quad PF) (Completed)    No orders of the defined types were placed in this encounter.   Follow-up: Return in about 1 year (around 09/25/2022), or if symptoms worsen or fail to improve.  Continue healthy lifestyle.  Information was given on health maintenance and disease prevention.  He will have a flu vaccine today.  Mliss Sax, MD

## 2021-10-29 ENCOUNTER — Encounter: Payer: No Typology Code available for payment source | Admitting: Family Medicine

## 2022-09-29 ENCOUNTER — Encounter: Payer: Self-pay | Admitting: Family Medicine

## 2022-09-29 ENCOUNTER — Ambulatory Visit (INDEPENDENT_AMBULATORY_CARE_PROVIDER_SITE_OTHER): Payer: No Typology Code available for payment source | Admitting: Family Medicine

## 2022-09-29 VITALS — BP 136/70 | HR 73 | Temp 97.5°F | Ht 71.0 in | Wt 189.6 lb

## 2022-09-29 DIAGNOSIS — Z0000 Encounter for general adult medical examination without abnormal findings: Secondary | ICD-10-CM

## 2022-09-29 DIAGNOSIS — Z0001 Encounter for general adult medical examination with abnormal findings: Secondary | ICD-10-CM | POA: Diagnosis not present

## 2022-09-29 DIAGNOSIS — L243 Irritant contact dermatitis due to cosmetics: Secondary | ICD-10-CM

## 2022-09-29 DIAGNOSIS — Z23 Encounter for immunization: Secondary | ICD-10-CM | POA: Diagnosis not present

## 2022-09-29 LAB — URINALYSIS, ROUTINE W REFLEX MICROSCOPIC
Bilirubin Urine: NEGATIVE
Hgb urine dipstick: NEGATIVE
Ketones, ur: NEGATIVE
Leukocytes,Ua: NEGATIVE
Nitrite: NEGATIVE
RBC / HPF: NONE SEEN (ref 0–?)
Specific Gravity, Urine: 1.025 (ref 1.000–1.030)
Total Protein, Urine: NEGATIVE
Urine Glucose: NEGATIVE
Urobilinogen, UA: 0.2 (ref 0.0–1.0)
WBC, UA: NONE SEEN (ref 0–?)
pH: 6 (ref 5.0–8.0)

## 2022-09-29 LAB — CBC
HCT: 41.9 % (ref 39.0–52.0)
Hemoglobin: 14.1 g/dL (ref 13.0–17.0)
MCHC: 33.6 g/dL (ref 30.0–36.0)
MCV: 92.1 fl (ref 78.0–100.0)
Platelets: 291 10*3/uL (ref 150.0–400.0)
RBC: 4.55 Mil/uL (ref 4.22–5.81)
RDW: 13.8 % (ref 11.5–15.5)
WBC: 5.1 10*3/uL (ref 4.0–10.5)

## 2022-09-29 MED ORDER — TRIAMCINOLONE ACETONIDE 0.1 % EX OINT: TOPICAL_OINTMENT | CUTANEOUS | 1 refills | Status: AC

## 2022-09-29 NOTE — Progress Notes (Signed)
Established Patient Office Visit  Subjective   Patient ID: Alvin Smith, male    DOB: Mar 29, 1981  Age: 41 y.o. MRN: [id number]  Chief Complaint  Patient presents with   Annual Exam    CPE, no concerns. Patient fasting.     HPI for physical this afternoon.  Doing well.  Continues to work hard in [employer].  Access to and with regular dental care.  He is fasting.  Develops a rash on the side of his neck after using cologne sometimes.  There is a rash on the back of his arms that bothers him from time to time.    Review of Systems  Constitutional: Negative.   HENT: Negative.    Eyes:  Negative for blurred vision, discharge and redness.  Respiratory: Negative.    Cardiovascular: Negative.   Gastrointestinal:  Negative for abdominal pain, blood in stool, constipation and melena.  Genitourinary: Negative.   Musculoskeletal: Negative.  Negative for myalgias.  Skin:  Positive for itching and rash.  Neurological:  Negative for tingling, loss of consciousness and weakness.  Endo/Heme/Allergies:  Negative for polydipsia.      Objective:     BP 136/70 (BP Location: Left Arm, Patient Position: Sitting, Cuff Size: Normal)   Pulse 73   Temp (!) 97.5 F (36.4 C) (Temporal)   Ht 5\' 11"  (1.803 m)   Wt 189 lb 9.6 oz (86 kg)   SpO2 96%   BMI 26.44 kg/m    Physical Exam Constitutional:      General: He is not in acute distress.    Appearance: Normal appearance. He is not ill-appearing, toxic-appearing or diaphoretic.  HENT:     Head: Normocephalic and atraumatic.     Right Ear: External ear normal.     Left Ear: External ear normal.     Mouth/Throat:     Mouth: Mucous membranes are moist.     Pharynx: Oropharynx is clear. No oropharyngeal exudate or posterior oropharyngeal erythema.  Eyes:     General: No scleral icterus.       Right eye: No discharge.        Left eye: No discharge.     Extraocular Movements: Extraocular movements intact.     Conjunctiva/sclera:  Conjunctivae normal.     Pupils: Pupils are equal, round, and reactive to light.  Cardiovascular:     Rate and Rhythm: Normal rate and regular rhythm.  Pulmonary:     Effort: Pulmonary effort is normal. No respiratory distress.     Breath sounds: Normal breath sounds.  Abdominal:     General: Bowel sounds are normal. There is no distension.     Tenderness: There is no abdominal tenderness. There is no guarding.  Musculoskeletal:     Cervical back: No rigidity or tenderness.  Skin:    General: Skin is warm and dry.       Neurological:     Mental Status: He is alert and oriented to person, place, and time.  Psychiatric:        Mood and Affect: Mood normal.        Behavior: Behavior normal.      No results found for any visits on 09/29/22.    The 10-year ASCVD risk score (Arnett DK, et al., 2019) is: 4.4%    Assessment & Plan:   Problem List Items Addressed This Visit       Other   Healthcare maintenance - Primary   Relevant Orders   CBC  Comprehensive metabolic panel   Urinalysis, Routine w reflex microscopic   Lipid panel   Other Visit Diagnoses     Irritant contact dermatitis due to cosmetics       Relevant Medications   triamcinolone ointment (KENALOG) 0.1 %   Need for influenza vaccination       Relevant Orders   Flu Vaccine QUAD 6+ mos PF IM (Fluarix Quad PF) (Completed)       Return in about 1 year (around 09/30/2023), or if symptoms worsen or fail to improve.  Information was given on health maintenance and the ending disease.  Information was also given on contact tinnitus.  Advised to use a skin moisturizer on the rash on the posterior arms.  Suggested that he use a gentler soap.  Mliss Sax, MD

## 2022-09-30 LAB — COMPREHENSIVE METABOLIC PANEL
ALT: 18 U/L (ref 0–53)
AST: 19 U/L (ref 0–37)
Albumin: 4.5 g/dL (ref 3.5–5.2)
Alkaline Phosphatase: 58 U/L (ref 39–117)
BUN: 18 mg/dL (ref 6–23)
CO2: 29 mEq/L (ref 19–32)
Calcium: 9.7 mg/dL (ref 8.4–10.5)
Chloride: 105 mEq/L (ref 96–112)
Creatinine, Ser: 0.98 mg/dL (ref 0.40–1.50)
GFR: 95.26 mL/min (ref >60.00)
Glucose, Bld: 100 mg/dL — ABNORMAL HIGH (ref 70–99)
Potassium: 4.3 mEq/L (ref 3.5–5.1)
Sodium: 141 mEq/L (ref 135–145)
Total Bilirubin: 0.6 mg/dL (ref 0.2–1.2)
Total Protein: 6.8 g/dL (ref 6.0–8.3)

## 2022-09-30 LAB — LIPID PANEL
Cholesterol: 236 mg/dL — ABNORMAL HIGH (ref 0–200)
HDL: 46.5 mg/dL (ref >39.00)
LDL Cholesterol: 152 mg/dL — ABNORMAL HIGH (ref 0–99)
NonHDL: 189.9
Total CHOL/HDL Ratio: 5
Triglycerides: 188 mg/dL — ABNORMAL HIGH (ref 0.0–149.0)
VLDL: 37.6 mg/dL (ref 0.0–40.0)

## 2023-10-05 ENCOUNTER — Encounter: Payer: No Typology Code available for payment source | Admitting: Family Medicine

## 2023-10-19 ENCOUNTER — Ambulatory Visit (INDEPENDENT_AMBULATORY_CARE_PROVIDER_SITE_OTHER): Payer: No Typology Code available for payment source | Admitting: Family Medicine

## 2023-10-19 ENCOUNTER — Encounter: Payer: Self-pay | Admitting: Family Medicine

## 2023-10-19 VITALS — BP 118/66 | HR 69 | Temp 98.1°F | Ht 71.0 in | Wt 203.6 lb

## 2023-10-19 DIAGNOSIS — Z131 Encounter for screening for diabetes mellitus: Secondary | ICD-10-CM

## 2023-10-19 DIAGNOSIS — Z23 Encounter for immunization: Secondary | ICD-10-CM | POA: Diagnosis not present

## 2023-10-19 DIAGNOSIS — Z125 Encounter for screening for malignant neoplasm of prostate: Secondary | ICD-10-CM | POA: Diagnosis not present

## 2023-10-19 DIAGNOSIS — Z0000 Encounter for general adult medical examination without abnormal findings: Secondary | ICD-10-CM | POA: Diagnosis not present

## 2023-10-19 DIAGNOSIS — E7800 Pure hypercholesterolemia, unspecified: Secondary | ICD-10-CM

## 2023-10-19 LAB — CBC WITH DIFFERENTIAL/PLATELET
Basophils Absolute: 0 10*3/uL (ref 0.0–0.1)
Basophils Relative: 0.9 % (ref 0.0–3.0)
Eosinophils Absolute: 0.2 10*3/uL (ref 0.0–0.7)
Eosinophils Relative: 3.9 % (ref 0.0–5.0)
HCT: 41.1 % (ref 39.0–52.0)
Hemoglobin: 13.9 g/dL (ref 13.0–17.0)
Lymphocytes Relative: 45.5 % (ref 12.0–46.0)
Lymphs Abs: 2 10*3/uL (ref 0.7–4.0)
MCHC: 33.8 g/dL (ref 30.0–36.0)
MCV: 92.6 fL (ref 78.0–100.0)
Monocytes Absolute: 0.5 10*3/uL (ref 0.1–1.0)
Monocytes Relative: 11 % (ref 3.0–12.0)
Neutro Abs: 1.7 10*3/uL (ref 1.4–7.7)
Neutrophils Relative %: 38.7 % — ABNORMAL LOW (ref 43.0–77.0)
Platelets: 339 10*3/uL (ref 150.0–400.0)
RBC: 4.45 Mil/uL (ref 4.22–5.81)
RDW: 13.3 % (ref 11.5–15.5)
WBC: 4.4 10*3/uL (ref 4.0–10.5)

## 2023-10-19 LAB — COMPREHENSIVE METABOLIC PANEL
ALT: 26 U/L (ref 0–53)
AST: 19 U/L (ref 0–37)
Albumin: 4.5 g/dL (ref 3.5–5.2)
Alkaline Phosphatase: 84 U/L (ref 39–117)
BUN: 18 mg/dL (ref 6–23)
CO2: 28 meq/L (ref 19–32)
Calcium: 9.5 mg/dL (ref 8.4–10.5)
Chloride: 104 meq/L (ref 96–112)
Creatinine, Ser: 1.25 mg/dL (ref 0.40–1.50)
GFR: 70.61 mL/min (ref >60.00)
Glucose, Bld: 87 mg/dL (ref 70–99)
Potassium: 4.6 meq/L (ref 3.5–5.1)
Sodium: 140 meq/L (ref 135–145)
Total Bilirubin: 0.5 mg/dL (ref 0.2–1.2)
Total Protein: 7 g/dL (ref 6.0–8.3)

## 2023-10-19 LAB — LIPID PANEL
Cholesterol: 236 mg/dL — ABNORMAL HIGH (ref 0–200)
HDL: 34.4 mg/dL — ABNORMAL LOW (ref >39.00)
LDL Cholesterol: 153 mg/dL — ABNORMAL HIGH (ref 0–99)
NonHDL: 202.04
Total CHOL/HDL Ratio: 7
Triglycerides: 243 mg/dL — ABNORMAL HIGH (ref 0.0–149.0)
VLDL: 48.6 mg/dL — ABNORMAL HIGH (ref 0.0–40.0)

## 2023-10-19 LAB — URINALYSIS, ROUTINE W REFLEX MICROSCOPIC
Bilirubin Urine: NEGATIVE
Hgb urine dipstick: NEGATIVE
Ketones, ur: NEGATIVE
Leukocytes,Ua: NEGATIVE
Nitrite: NEGATIVE
RBC / HPF: NONE SEEN (ref 0–?)
Specific Gravity, Urine: 1.025 (ref 1.000–1.030)
Total Protein, Urine: NEGATIVE
Urine Glucose: NEGATIVE
Urobilinogen, UA: 1 (ref 0.0–1.0)
pH: 6.5 (ref 5.0–8.0)

## 2023-10-19 LAB — HEMOGLOBIN A1C: Hgb A1c MFr Bld: 6.5 % (ref 4.6–6.5)

## 2023-10-19 LAB — PSA: PSA: 1.05 ng/mL (ref 0.10–4.00)

## 2023-10-19 NOTE — Progress Notes (Signed)
Established Patient Office Visit   Subjective:  Patient ID: Alvin Smith, male    DOB: 1981-09-13  Age: 42 y.o. MRN: [id number]  Chief Complaint  Patient presents with   Annual Exam    CPE Pt is not fasting.     HPI Encounter Diagnoses  Name Primary?   Healthcare maintenance Yes   Screening for diabetes mellitus    Screening for prostate cancer    Elevated LDL cholesterol level    Immunization due    In for physical today.  He is drinking a sports drink this morning but is otherwise fasting.  He and his wife are expecting it in a minute and it will be their fifth child.  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  He is active on his job and does have regular dental care.  He is considering becoming a truck   Review of Systems  Constitutional: Negative.   HENT: Negative.    Eyes:  Negative for blurred vision, discharge and redness.  Respiratory: Negative.    Cardiovascular: Negative.   Gastrointestinal:  Negative for abdominal pain.  Genitourinary: Negative.   Musculoskeletal: Negative.  Negative for myalgias.  Skin:  Negative for rash.  Neurological:  Negative for tingling, loss of consciousness and weakness.  Endo/Heme/Allergies:  Negative for polydipsia.      10/19/2023    9:08 AM 09/29/2022    1:33 PM 09/29/2022    1:10 PM  Depression screen PHQ 2/9  Decreased Interest 0 0 0  Down, Depressed, Hopeless 0 0 0  PHQ - 2 Score 0 0 0  Altered sleeping 0 0   Tired, decreased energy 0 0   Change in appetite 0 0   Feeling bad or failure about yourself  0 0   Trouble concentrating 0 0   Moving slowly or fidgety/restless 0 0   Suicidal thoughts 0 0   PHQ-9 Score 0 0   Difficult doing work/chores Not difficult at all Not difficult at all       Current Outpatient Medications:    triamcinolone ointment (KENALOG) 0.1 %, Apply a thin coat to rash on neck twice daily as needed., Disp: 30 g, Rfl: 1   Objective:     BP 118/66   Pulse 69   Temp 98.1  F (36.7 C)   Ht 5\' 11"  (1.803 m)   Wt 203 lb 9.6 oz (92.4 kg)   SpO2 96%   BMI 28.40 kg/m  Wt Readings from Last 3 Encounters:  10/19/23 203 lb 9.6 oz (92.4 kg)  09/29/22 189 lb 9.6 oz (86 kg)  09/25/21 189 lb 6.4 oz (85.9 kg)      Physical Exam Constitutional:      General: He is not in acute distress.    Appearance: Normal appearance. He is not ill-appearing, toxic-appearing or diaphoretic.  HENT:     Head: Normocephalic and atraumatic.     Right Ear: Tympanic membrane, ear canal and external ear normal.     Left Ear: Tympanic membrane, ear canal and external ear normal.     Mouth/Throat:     Mouth: Mucous membranes are moist.     Pharynx: Oropharynx is clear. No oropharyngeal exudate or posterior oropharyngeal erythema.  Eyes:     General: No scleral icterus.       Right eye: No discharge.        Left eye: No discharge.     Extraocular Movements: Extraocular movements intact.  Conjunctiva/sclera: Conjunctivae normal.     Pupils: Pupils are equal, round, and reactive to light.  Cardiovascular:     Rate and Rhythm: Normal rate and regular rhythm.  Pulmonary:     Effort: Pulmonary effort is normal. No respiratory distress.     Breath sounds: Normal breath sounds.  Abdominal:     General: Bowel sounds are normal.     Tenderness: There is no abdominal tenderness. There is no guarding.     Hernia: There is no hernia in the left inguinal area or right inguinal area.  Genitourinary:    Penis: Circumcised. No hypospadias, erythema, tenderness, discharge, swelling or lesions.      Testes:        Right: Mass, tenderness or swelling not present. Right testis is descended.        Left: Mass, tenderness or swelling not present. Left testis is descended.     Epididymis:     Right: Not inflamed or enlarged.     Left: Not inflamed or enlarged.  Musculoskeletal:     Cervical back: No rigidity or tenderness.  Lymphadenopathy:     Cervical: No cervical adenopathy.     Lower  Body: No right inguinal adenopathy. No left inguinal adenopathy.  Skin:    General: Skin is warm and dry.  Neurological:     Mental Status: He is alert and oriented to person, place, and time.  Psychiatric:        Mood and Affect: Mood normal.        Behavior: Behavior normal.      No results found for any visits on 10/19/23.    The 10-year ASCVD risk score (Arnett DK, et al., 2019) is: 3.4%    Assessment & Plan:   Healthcare maintenance -     CBC with Differential/Platelet -     Urinalysis, Routine w reflex microscopic  Screening for diabetes mellitus -     Comprehensive metabolic panel -     Hemoglobin A1c  Screening for prostate cancer -     PSA  Elevated LDL cholesterol level -     Comprehensive metabolic panel -     Lipid panel  Immunization due -     Tdap vaccine greater than or equal to 7yo IM    Return in about 1 year (around 10/18/2024), or if symptoms worsen or fail to improve.  Information was given on health maintenance and disease prevention.  Also gave information on preventing high cholesterol.  Mliss Sax, MD

## 2024-08-31 ENCOUNTER — Emergency Department (HOSPITAL_BASED_OUTPATIENT_CLINIC_OR_DEPARTMENT_OTHER)

## 2024-08-31 ENCOUNTER — Encounter (HOSPITAL_BASED_OUTPATIENT_CLINIC_OR_DEPARTMENT_OTHER): Payer: Self-pay

## 2024-08-31 ENCOUNTER — Other Ambulatory Visit: Payer: Self-pay

## 2024-08-31 ENCOUNTER — Emergency Department (HOSPITAL_BASED_OUTPATIENT_CLINIC_OR_DEPARTMENT_OTHER)
Admission: EM | Admit: 2024-08-31 | Discharge: 2024-08-31 | Disposition: A | Discharge status: Home / Self Care | Source: Ambulatory Visit | Attending: Emergency Medicine | Admitting: Emergency Medicine

## 2024-08-31 DIAGNOSIS — Y92410 Unspecified street and highway as the place of occurrence of the external cause: Secondary | ICD-10-CM | POA: Insufficient documentation

## 2024-08-31 DIAGNOSIS — S39012A Strain of muscle, fascia and tendon of lower back, initial encounter: Secondary | ICD-10-CM | POA: Diagnosis not present

## 2024-08-31 DIAGNOSIS — S161XXA Strain of muscle, fascia and tendon at neck level, initial encounter: Secondary | ICD-10-CM | POA: Diagnosis not present

## 2024-08-31 DIAGNOSIS — R030 Elevated blood-pressure reading, without diagnosis of hypertension: Secondary | ICD-10-CM | POA: Diagnosis not present

## 2024-08-31 DIAGNOSIS — S20212A Contusion of left front wall of thorax, initial encounter: Secondary | ICD-10-CM | POA: Insufficient documentation

## 2024-08-31 DIAGNOSIS — S0093XA Contusion of unspecified part of head, initial encounter: Secondary | ICD-10-CM | POA: Diagnosis not present

## 2024-08-31 DIAGNOSIS — R0789 Other chest pain: Secondary | ICD-10-CM | POA: Diagnosis not present

## 2024-08-31 DIAGNOSIS — S199XXA Unspecified injury of neck, initial encounter: Secondary | ICD-10-CM | POA: Diagnosis present

## 2024-08-31 MED ORDER — IBUPROFEN 400 MG PO TABS
400.0000 mg | ORAL_TABLET | Freq: Once | ORAL | Status: AC
  Administered 2024-08-31: 400 mg via ORAL
  Filled 2024-08-31: qty 1

## 2024-08-31 MED ORDER — METHOCARBAMOL 750 MG PO TABS: 750.0000 mg | ORAL_TABLET | Freq: Three times a day (TID) | ORAL | 0 refills | Status: AC | PRN

## 2024-08-31 MED ORDER — ACETAMINOPHEN 500 MG PO TABS
1000.0000 mg | ORAL_TABLET | Freq: Once | ORAL | Status: AC
  Administered 2024-08-31: 1000 mg via ORAL
  Filled 2024-08-31: qty 2

## 2024-08-31 NOTE — ED Notes (Addendum)
 MD re-notified of pt in triage room by NT

## 2024-08-31 NOTE — ED Notes (Signed)
 Triage RN asked MD to evaluate pt to determine which scans are needed. MD asked for pt to be placed in triage room for eval

## 2024-08-31 NOTE — ED Notes (Signed)
 MD re-notified of pt in triage room
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
## 2024-08-31 NOTE — ED Notes (Signed)
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
## 2024-08-31 NOTE — Discharge Instructions (Addendum)
 It was our pleasure to provide your ER care today - we hope that you feel better.  Take acetaminophen or ibuprofen as need for pain. You may also take robaxin as need for muscle pain/spasm - no driving when taking.   Follow up with primary care doctor in 1-2 weeks if symptoms fail to improve/resolve. Your blood pressure is high today - follow up with primary care doctor in 1-2 weeks.   Return to ER if worse, new symptoms, new/severe pain, trouble breathing, or other emergency concern.

## 2024-09-09 ENCOUNTER — Ambulatory Visit
Admission: EM | Admit: 2024-09-09 | Discharge: 2024-09-09 | Disposition: A | Discharge status: Home / Self Care | Attending: Physician Assistant | Admitting: Physician Assistant

## 2024-09-09 ENCOUNTER — Ambulatory Visit: Payer: Self-pay

## 2024-09-09 ENCOUNTER — Other Ambulatory Visit: Payer: Self-pay

## 2024-09-09 DIAGNOSIS — S060X0D Concussion without loss of consciousness, subsequent encounter: Secondary | ICD-10-CM | POA: Diagnosis not present
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
## 2024-09-20 ENCOUNTER — Encounter: Payer: Self-pay | Admitting: Family Medicine

## 2024-09-20 ENCOUNTER — Ambulatory Visit: Admitting: Family Medicine

## 2024-09-20 VITALS — BP 124/84 | HR 65 | Temp 97.6°F | Ht 72.0 in | Wt 213.4 lb

## 2024-09-20 DIAGNOSIS — M25512 Pain in left shoulder: Secondary | ICD-10-CM

## 2024-09-20 DIAGNOSIS — M542 Cervicalgia: Secondary | ICD-10-CM | POA: Diagnosis not present

## 2024-09-20 DIAGNOSIS — M545 Low back pain, unspecified: Secondary | ICD-10-CM | POA: Diagnosis not present

## 2024-09-20 DIAGNOSIS — R519 Headache, unspecified: Secondary | ICD-10-CM

## 2024-09-20 DIAGNOSIS — R7303 Prediabetes: Secondary | ICD-10-CM

## 2024-09-20 MED ORDER — AMITRIPTYLINE HCL 25 MG PO TABS: 25.0000 mg | ORAL_TABLET | Freq: Every day | ORAL | 1 refills | Status: AC
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
## 2024-10-03 ENCOUNTER — Ambulatory Visit: Admitting: Family Medicine

## 2024-10-03 VITALS — BP 166/88 | HR 79 | Ht 72.0 in | Wt 216.0 lb

## 2024-10-03 DIAGNOSIS — M542 Cervicalgia: Secondary | ICD-10-CM

## 2024-10-03 DIAGNOSIS — M545 Low back pain, unspecified: Secondary | ICD-10-CM | POA: Diagnosis not present

## 2024-10-03 DIAGNOSIS — G8929 Other chronic pain: Secondary | ICD-10-CM | POA: Diagnosis not present
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
## 2024-10-03 NOTE — Patient Instructions (Addendum)
 Thank you for coming in today.   OK to advance activity as tolerated. Including weightlifting.  Start low and slowly increase effort.   I can add PT with a  mychrt message or phone call as needed.   Return to work as scheduled on Dec 8th. We can modify that as needed.

## 2024-10-31 ENCOUNTER — Ambulatory Visit (INDEPENDENT_AMBULATORY_CARE_PROVIDER_SITE_OTHER): Admitting: Emergency Medicine

## 2024-10-31 ENCOUNTER — Ambulatory Visit: Payer: Self-pay

## 2024-10-31 VITALS — BP 118/82 | HR 89 | Temp 98.9°F | Resp 16 | Ht 72.0 in | Wt 218.0 lb

## 2024-10-31 DIAGNOSIS — J209 Acute bronchitis, unspecified: Secondary | ICD-10-CM

## 2024-10-31 MED ORDER — ALBUTEROL SULFATE HFA 108 (90 BASE) MCG/ACT IN AERS: 2.0000 | INHALATION_SPRAY | Freq: Four times a day (QID) | RESPIRATORY_TRACT | 2 refills | Status: AC | PRN

## 2024-10-31 MED ORDER — PREDNISONE 20 MG PO TABS: 40.0000 mg | ORAL_TABLET | Freq: Every day | ORAL | 0 refills | Status: AC

## 2024-10-31 NOTE — Progress Notes (Signed)
" ° °  Assessment & Plan:  1. Acute bronchitis, unspecified organism (Primary) Lungs clear no hypoxia chills improved no indication at this time of bacterial PNA. Sounds most consistent with viral bronchitis. Rx as below, supportive care and recheck precautions reviewed.  - predniSONE  (DELTASONE ) 20 MG tablet; Take 2 tablets (40 mg total) by mouth daily for 5 days.  Dispense: 10 tablet; Refill: 0 - albuterol  (VENTOLIN  HFA) 108 (90 Base) MCG/ACT inhaler; Inhale 2 puffs into the lungs every 6 (six) hours as needed for wheezing or shortness of breath.  Dispense: 8 g; Refill: 2       Corean Geralds, MSPAS, PA-C   Subjective:  Cough (Cough, congestion, slight body aches x 1 week. Denies fever. )   HPI: Alvin Smith is a 43 y.o. male presenting with acute cough Sx starting a week ago Chills/ache improved from early illness Still congestion and cough. Wheezing present at varying times of day, improves some with Mucinex-DM.  Whole house has been sick, others seem to be improving more than him.     ROS: Negative unless specifically indicated above in HPI.   Relevant past medical history reviewed and updated as indicated.   Allergies and medications reviewed and updated.  Current Medications[1]  Allergies[2]    Objective:   Vitals:   10/31/24 1122  BP: 118/82  Pulse: 89  Temp: 98.9 F (37.2 C)  Resp: 16  Height: 6' (1.829 m)  Weight: 218 lb (98.9 kg)  SpO2: 96%  BMI (Calculated): 29.56      Gen: appears well, alert, INAD Ears: Right canal normal. Right TM serous effusion. . Left canal normal. Left TM serous effusion. .  Nose: normal and patent, no erythema, discharge or polyps Mouth: Oral mucosa moist. Throat: cobblestoned  Neck: supple and no adenopathy Heart RRR Lungs: Respiratory effort: normal.  clear to auscultation, no wheezes, rales, or rhonchi Skin: Warm and dry without acute rash to exposed areas.           [1]  Current Outpatient Medications:     albuterol  (VENTOLIN  HFA) 108 (90 Base) MCG/ACT inhaler, Inhale 2 puffs into the lungs every 6 (six) hours as needed for wheezing or shortness of breath., Disp: 8 g, Rfl: 2   amitriptyline  (ELAVIL ) 25 MG tablet, Take 1 tablet (25 mg total) by mouth at bedtime., Disp: 30 tablet, Rfl: 1   methocarbamol  (ROBAXIN ) 750 MG tablet, Take 1 tablet (750 mg total) by mouth 3 (three) times daily as needed (muscle spasm/pain)., Disp: 15 tablet, Rfl: 0   naproxen (NAPROSYN) 500 MG tablet, Take 500 mg by mouth 2 (two) times daily., Disp: , Rfl:    predniSONE  (DELTASONE ) 20 MG tablet, Take 2 tablets (40 mg total) by mouth daily for 5 days., Disp: 10 tablet, Rfl: 0   triamcinolone  ointment (KENALOG ) 0.1 %, Apply a thin coat to rash on neck twice daily as needed., Disp: 30 g, Rfl: 1 [2] No Known Allergies  "

## 2024-10-31 NOTE — Telephone Encounter (Signed)
 FYI Only or Action Required?: Action required by provider: request for appointment.  Patient was last seen in primary care on 09/20/2024 by Berneta Elsie Sayre, MD.  Called Nurse Triage reporting Cough.  Symptoms began a week ago.  Interventions attempted: Rest, hydration, or home remedies.  Symptoms are: gradually worsening.Worsening cough, more congestion. Fatigue, wheezing. SOB with coughing spells.  Triage Disposition: See HCP Within 4 Hours (Or PCP Triage)  Patient/caregiver understands and will follow disposition?: Yes      Reason for Disposition  [1] MILD difficulty breathing (e.g., minimal/no SOB at rest, SOB with walking, pulse < 100) AND [2] still present when not coughing  Answer Assessment - Initial Assessment Questions 1. ONSET: When did the cough begin?      Last week 2. SEVERITY: How bad is the cough today?      severe 3. SPUTUM: Describe the color of your sputum (e.g., none, dry cough; clear, white, yellow, green)     no 4. HEMOPTYSIS: Are you coughing up any blood? If Yes, ask: How much? (e.g., flecks, streaks, tablespoons, etc.)     no 5. DIFFICULTY BREATHING: Are you having difficulty breathing? If Yes, ask: How bad is it? (e.g., mild, moderate, severe)      yes 6. FEVER: Do you have a fever? If Yes, ask: What is your temperature, how was it measured, and when did it start?     no 7. CARDIAC HISTORY: Do you have any history of heart disease? (e.g., heart attack, congestive heart failure)      no 8. LUNG HISTORY: Do you have any history of lung disease?  (e.g., pulmonary embolus, asthma, emphysema)     no 9. PE RISK FACTORS: Do you have a history of blood clots? (or: recent major surgery, recent prolonged travel, bedridden)     no 10. OTHER SYMPTOMS: Do you have any other symptoms? (e.g., runny nose, wheezing, chest pain)       wheezing 11. PREGNANCY: Is there any chance you are pregnant? When was your last menstrual  period?       N/a 12. TRAVEL: Have you traveled out of the country in the last month? (e.g., travel history, exposures)       no  Protocols used: Cough - Acute Productive-A-AH

## 2024-11-17 ENCOUNTER — Encounter: Payer: Self-pay | Admitting: Family Medicine

## 2025-01-06 ENCOUNTER — Encounter: Payer: Self-pay | Admitting: Family Medicine
